# Patient Record
Sex: Female | Born: 1975
Health system: Southern US, Community
[De-identification: ages and names within clinical notes are randomized; demographics above are authoritative.]

## PROBLEM LIST (undated history)

## (undated) DIAGNOSIS — Z8632 Personal history of gestational diabetes: Secondary | ICD-10-CM

## (undated) DIAGNOSIS — F418 Other specified anxiety disorders: Secondary | ICD-10-CM

## (undated) DIAGNOSIS — N200 Calculus of kidney: Secondary | ICD-10-CM

## (undated) DIAGNOSIS — K056 Periodontal disease, unspecified: Secondary | ICD-10-CM

## (undated) DIAGNOSIS — O24419 Gestational diabetes mellitus in pregnancy, unspecified control: Secondary | ICD-10-CM

## (undated) DIAGNOSIS — Z87442 Personal history of urinary calculi: Secondary | ICD-10-CM

## (undated) DIAGNOSIS — F32A Depression, unspecified: Secondary | ICD-10-CM

## (undated) HISTORY — DX: Other specified anxiety disorders: F41.8

## (undated) HISTORY — DX: Depression, unspecified: F32.A

## (undated) HISTORY — PX: VOCAL CORD LATERALIZATION, ENDOSCOPIC APPROACH W/ MLB: SHX2664

## (undated) HISTORY — PX: LITHOTRIPSY: SUR834

---

## 1998-08-01 ENCOUNTER — Encounter: Payer: Self-pay | Admitting: Otolaryngology

## 1998-08-02 ENCOUNTER — Inpatient Hospital Stay (HOSPITAL_COMMUNITY): Admission: RE | Admit: 1998-08-02 | Discharge: 1998-08-03 | Payer: Self-pay | Admitting: Otolaryngology

## 2014-07-13 ENCOUNTER — Emergency Department (HOSPITAL_BASED_OUTPATIENT_CLINIC_OR_DEPARTMENT_OTHER)
Admission: EM | Admit: 2014-07-13 | Discharge: 2014-07-13 | Disposition: A | Discharge status: Home / Self Care | Payer: Self-pay | Attending: Emergency Medicine | Admitting: Emergency Medicine

## 2014-07-13 ENCOUNTER — Encounter (HOSPITAL_BASED_OUTPATIENT_CLINIC_OR_DEPARTMENT_OTHER): Payer: Self-pay | Admitting: Emergency Medicine

## 2014-07-13 DIAGNOSIS — R197 Diarrhea, unspecified: Secondary | ICD-10-CM

## 2014-07-13 DIAGNOSIS — Z8632 Personal history of gestational diabetes: Secondary | ICD-10-CM | POA: Insufficient documentation

## 2014-07-13 DIAGNOSIS — Z3202 Encounter for pregnancy test, result negative: Secondary | ICD-10-CM | POA: Insufficient documentation

## 2014-07-13 DIAGNOSIS — B349 Viral infection, unspecified: Secondary | ICD-10-CM | POA: Insufficient documentation

## 2014-07-13 DIAGNOSIS — Z87442 Personal history of urinary calculi: Secondary | ICD-10-CM | POA: Insufficient documentation

## 2014-07-13 DIAGNOSIS — Z720 Tobacco use: Secondary | ICD-10-CM | POA: Insufficient documentation

## 2014-07-13 HISTORY — DX: Calculus of kidney: N20.0

## 2014-07-13 HISTORY — DX: Gestational diabetes mellitus in pregnancy, unspecified control: O24.419

## 2014-07-13 LAB — URINALYSIS, ROUTINE W REFLEX MICROSCOPIC
Bilirubin Urine: NEGATIVE
Glucose, UA: NEGATIVE mg/dL
Hgb urine dipstick: NEGATIVE
Ketones, ur: 15 mg/dL — AB
Leukocytes, UA: NEGATIVE
Nitrite: NEGATIVE
PH: 7.5 (ref 5.0–8.0)
Protein, ur: NEGATIVE mg/dL
Specific Gravity, Urine: 1.022 (ref 1.005–1.030)
UROBILINOGEN UA: 0.2 mg/dL (ref 0.0–1.0)

## 2014-07-13 LAB — COMPREHENSIVE METABOLIC PANEL
ALT: 14 U/L (ref 14–54)
AST: 16 U/L (ref 15–41)
Albumin: 3.9 g/dL (ref 3.5–5.0)
Alkaline Phosphatase: 92 U/L (ref 38–126)
Anion gap: 6 (ref 5–15)
BUN: 17 mg/dL (ref 6–20)
CALCIUM: 8.9 mg/dL (ref 8.9–10.3)
CO2: 24 mmol/L (ref 22–32)
CREATININE: 0.75 mg/dL (ref 0.44–1.00)
Chloride: 109 mmol/L (ref 101–111)
GFR calc non Af Amer: >60 mL/min (ref >60)
Glucose, Bld: 117 mg/dL — ABNORMAL HIGH (ref 70–99)
Potassium: 3.4 mmol/L — ABNORMAL LOW (ref 3.5–5.1)
SODIUM: 139 mmol/L (ref 135–145)
Total Bilirubin: 0.3 mg/dL (ref 0.3–1.2)
Total Protein: 6.9 g/dL (ref 6.5–8.1)

## 2014-07-13 LAB — CBC WITH DIFFERENTIAL/PLATELET
Basophils Absolute: 0 10*3/uL (ref 0.0–0.1)
Basophils Relative: 0 % (ref 0–1)
Eosinophils Absolute: 0.2 10*3/uL (ref 0.0–0.7)
Eosinophils Relative: 2 % (ref 0–5)
HCT: 38.1 % (ref 36.0–46.0)
Hemoglobin: 12.5 g/dL (ref 12.0–15.0)
Lymphocytes Relative: 10 % — ABNORMAL LOW (ref 12–46)
Lymphs Abs: 1.2 10*3/uL (ref 0.7–4.0)
MCH: 27.5 pg (ref 26.0–34.0)
MCHC: 32.8 g/dL (ref 30.0–36.0)
MCV: 83.9 fL (ref 78.0–100.0)
Monocytes Absolute: 0.5 10*3/uL (ref 0.1–1.0)
Monocytes Relative: 4 % (ref 3–12)
Neutro Abs: 10.7 10*3/uL — ABNORMAL HIGH (ref 1.7–7.7)
Neutrophils Relative %: 84 % — ABNORMAL HIGH (ref 43–77)
Platelets: 250 10*3/uL (ref 150–400)
RBC: 4.54 MIL/uL (ref 3.87–5.11)
RDW: 15.6 % — ABNORMAL HIGH (ref 11.5–15.5)
WBC: 12.7 10*3/uL — ABNORMAL HIGH (ref 4.0–10.5)

## 2014-07-13 LAB — PREGNANCY, URINE: PREG TEST UR: NEGATIVE

## 2014-07-13 LAB — LIPASE, BLOOD: LIPASE: 25 U/L (ref 22–51)

## 2014-07-13 MED ORDER — ONDANSETRON HCL 4 MG/2ML IJ SOLN
4.0000 mg | Freq: Once | INTRAMUSCULAR | Status: AC
  Administered 2014-07-13: 4 mg via INTRAVENOUS

## 2014-07-13 MED ORDER — ONDANSETRON HCL 4 MG PO TABS: 4.0000 mg | ORAL_TABLET | Freq: Four times a day (QID) | ORAL | Status: DC

## 2014-07-13 MED ORDER — SODIUM CHLORIDE 0.9 % IV BOLUS (SEPSIS)
1000.0000 mL | Freq: Once | INTRAVENOUS | Status: AC
  Administered 2014-07-13: 1000 mL via INTRAVENOUS

## 2014-07-13 NOTE — ED Provider Notes (Signed)
CSN: [id number]     Arrival date & time 07/13/14  1156 History   First MD Initiated Contact with Patient 07/13/14 1301     Chief Complaint  Patient presents with  . Diarrhea     (Consider location/radiation/quality/duration/timing/severity/associated sxs/prior Treatment) HPI Comments: 39 year old female complaining of diarrhea and nausea beginning at 3:30 AM today. Reports 3 episodes of nonbloody diarrhea today. No associated abdominal pain, fever or vomiting. No sick contacts. Had [employer] for dinner last night. Had no appetite this morning and has not eaten anything. No aggravating or alleviating factors. States she is starting to feel weak and tired. Traveling from [place] to [place] today for [place].  Patient is a 39 y.o. female presenting with diarrhea. The history is provided by the patient.  Diarrhea   Past Medical History  Diagnosis Date  . Kidney stone   . Gestational diabetes    Past Surgical History  Procedure Laterality Date  . Lithotripsy     No family history on file. History  Substance Use Topics  . Smoking status: Current Every Day Smoker  . Smokeless tobacco: Not on file  . Alcohol Use: Not on file   OB History    No data available     Review of Systems  Constitutional: Positive for fatigue.  Gastrointestinal: Positive for nausea and diarrhea.  Neurological: Positive for weakness.  All other systems reviewed and are negative.     Allergies  Review of patient's allergies indicates no known allergies.  Home Medications   Prior to Admission medications   Medication Sig Start Date End Date Taking? Authorizing Provider  ondansetron (ZOFRAN) 4 MG tablet Take 1 tablet (4 mg total) by mouth every 6 (six) hours. 07/13/14   Dalana Pfahler M Eldonna Neuenfeldt, PA-C   BP 103/80 mmHg  Pulse 85  Temp(Src) 98.1 F (36.7 C) (Oral)  Resp 18  Ht 5\' 8"  (1.727 m)  Wt 180 lb (81.647 kg)  BMI 27.38 kg/m2  SpO2 100%  LMP 07/09/2014 Physical Exam  Constitutional: She is  oriented to person, place, and time. She appears well-developed and well-nourished. No distress.  HENT:  Head: Normocephalic and atraumatic.  Mouth/Throat: Oropharynx is clear and moist.  Eyes: Conjunctivae and EOM are normal.  Neck: Normal range of motion. Neck supple.  Cardiovascular: Normal rate, regular rhythm and normal heart sounds.   Pulmonary/Chest: Effort normal and breath sounds normal. No respiratory distress.  Abdominal: Soft. Bowel sounds are normal. She exhibits no distension and no mass. There is no tenderness. There is no rebound and no guarding.  Musculoskeletal: Normal range of motion. She exhibits no edema.  Neurological: She is alert and oriented to person, place, and time. No sensory deficit.  Skin: Skin is warm and dry.  Psychiatric: She has a normal mood and affect. Her behavior is normal.  Nursing note and vitals reviewed.   ED Course  Procedures (including critical care time) Labs Review Labs Reviewed  CBC WITH DIFFERENTIAL/PLATELET - Abnormal; Notable for the following:    WBC 12.7 (*)    RDW 15.6 (*)    Neutrophils Relative % 84 (*)    Neutro Abs 10.7 (*)    Lymphocytes Relative 10 (*)    All other components within normal limits  COMPREHENSIVE METABOLIC PANEL - Abnormal; Notable for the following:    Potassium 3.4 (*)    Glucose, Bld 117 (*)    All other components within normal limits  URINALYSIS, ROUTINE W REFLEX MICROSCOPIC - Abnormal; Notable for the following:  Ketones, ur 15 (*)    All other components within normal limits  LIPASE, BLOOD  PREGNANCY, URINE    Imaging Review No results found.   EKG Interpretation None      MDM   Final diagnoses:  Diarrhea  Viral illness   Nontoxic appearing, NAD. AF VSS. Abdomen soft and nontender. Moist mucous membranes. Received a bolus of IV fluids prior to my evaluation. Labs obtained in triage prior to patient being seen, leukocytosis of 12.7, no other acute findings. No associated abdominal  pain, fever or vomiting. This is most likely a viral illness, possibly from eating [employer] last night. Will discharge home with Zofran. Advised her to stay well-hydrated. Stable for discharge. Return precautions given. Patient states understanding of treatment care plan and is agreeable.  Kathrynn SpeedRobyn M Verlisa Vara, PA-C 07/13/14 1338  Arby BarretteMarcy Pfeiffer, MD 07/13/14 1524

## 2014-07-13 NOTE — ED Notes (Signed)
Pt had two episodes of diarrhea with some nausea since 0330 this am.   No known fever.  VSS.  CBG 111.

## 2014-07-13 NOTE — Discharge Instructions (Signed)
Take Zofran as directed as needed for nausea. Rest and stay well-hydrated.  Diarrhea Diarrhea is frequent loose and watery bowel movements. It can cause you to feel weak and dehydrated. Dehydration can cause you to become tired and thirsty, have a dry mouth, and have decreased urination that often is dark yellow. Diarrhea is a sign of another problem, most often an infection that will not last long. In most cases, diarrhea typically lasts 2-3 days. However, it can last longer if it is a sign of something more serious. It is important to treat your diarrhea as directed by your caregiver to lessen or prevent future episodes of diarrhea. CAUSES  Some common causes include:  Gastrointestinal infections caused by viruses, bacteria, or parasites.  Food poisoning or food allergies.  Certain medicines, such as antibiotics, chemotherapy, and laxatives.  Artificial sweeteners and fructose.  Digestive disorders. HOME CARE INSTRUCTIONS  Ensure adequate fluid intake (hydration): Have 1 cup (8 oz) of fluid for each diarrhea episode. Avoid fluids that contain simple sugars or sports drinks, fruit juices, whole milk products, and sodas. Your urine should be clear or pale yellow if you are drinking enough fluids. Hydrate with an oral rehydration solution that you can purchase at pharmacies, retail stores, and online. You can prepare an oral rehydration solution at home by mixing the following ingredients together:   - tsp table salt.   tsp baking soda.   tsp salt substitute containing potassium chloride.  1  tablespoons sugar.  1 L (34 oz) of water.  Certain foods and beverages may increase the speed at which food moves through the gastrointestinal (GI) tract. These foods and beverages should be avoided and include:  Caffeinated and alcoholic beverages.  High-fiber foods, such as raw fruits and vegetables, nuts, seeds, and whole grain breads and cereals.  Foods and beverages sweetened with sugar  alcohols, such as xylitol, sorbitol, and mannitol.  Some foods may be well tolerated and may help thicken stool including:  Starchy foods, such as rice, toast, pasta, low-sugar cereal, oatmeal, grits, baked potatoes, crackers, and bagels.  Bananas.  Applesauce.  Add probiotic-rich foods to help increase healthy bacteria in the GI tract, such as yogurt and fermented milk products.  Wash your hands well after each diarrhea episode.  Only take over-the-counter or prescription medicines as directed by your caregiver.  Take a warm bath to relieve any burning or pain from frequent diarrhea episodes. SEEK IMMEDIATE MEDICAL CARE IF:   You are unable to keep fluids down.  You have persistent vomiting.  You have blood in your stool, or your stools are black and tarry.  You do not urinate in 6-8 hours, or there is only a small amount of very dark urine.  You have abdominal pain that increases or localizes.  You have weakness, dizziness, confusion, or light-headedness.  You have a severe headache.  Your diarrhea gets worse or does not get better.  You have a fever or persistent symptoms for more than 2-3 days.  You have a fever and your symptoms suddenly get worse. MAKE SURE YOU:   Understand these instructions.  Will watch your condition.  Will get help right away if you are not doing well or get worse. Document Released: 02/08/2002 Document Revised: 07/05/2013 Document Reviewed: 10/27/2011 [facility] Patient Information 2015 [place], [place]. This information is not intended to replace advice given to you by your health care provider. Make sure you discuss any questions you have with your health care provider.  Food Choices to  Help Relieve Diarrhea When you have diarrhea, the foods you eat and your eating habits are very important. Choosing the right foods and drinks can help relieve diarrhea. Also, because diarrhea can last up to 7 days, you need to replace lost fluids and  electrolytes (such as sodium, potassium, and chloride) in order to help prevent dehydration.  WHAT GENERAL GUIDELINES DO I NEED TO FOLLOW?  Slowly drink 1 cup (8 oz) of fluid for each episode of diarrhea. If you are getting enough fluid, your urine will be clear or pale yellow.  Eat starchy foods. Some good choices include white rice, white toast, pasta, low-fiber cereal, baked potatoes (without the skin), saltine crackers, and bagels.  Avoid large servings of any cooked vegetables.  Limit fruit to two servings per day. A serving is  cup or 1 small piece.  Choose foods with less than 2 g of fiber per serving.  Limit fats to less than 8 tsp (38 g) per day.  Avoid fried foods.  Eat foods that have probiotics in them. Probiotics can be found in certain dairy products.  Avoid foods and beverages that may increase the speed at which food moves through the stomach and intestines (gastrointestinal tract). Things to avoid include:  High-fiber foods, such as dried fruit, raw fruits and vegetables, nuts, seeds, and whole grain foods.  Spicy foods and high-fat foods.  Foods and beverages sweetened with high-fructose corn syrup, honey, or sugar alcohols such as xylitol, sorbitol, and mannitol. WHAT FOODS ARE RECOMMENDED? Grains White rice. White, [place], or pita breads (fresh or toasted), including plain rolls, buns, or bagels. White pasta. Saltine, soda, or graham crackers. Pretzels. Low-fiber cereal. Cooked cereals made with water (such as cornmeal, farina, or cream cereals). Plain muffins. Matzo. Melba toast. Zwieback.  Vegetables Potatoes (without the skin). Strained tomato and vegetable juices. Most well-cooked and canned vegetables without seeds. Tender lettuce. Fruits Cooked or canned applesauce, apricots, cherries, fruit cocktail, grapefruit, peaches, pears, or plums. Fresh bananas, apples without skin, cherries, grapes, cantaloupe, grapefruit, peaches, oranges, or plums.  Meat and  Other Protein Products Baked or boiled chicken. Eggs. Tofu. Fish. Seafood. Smooth peanut butter. Ground or well-cooked tender beef, ham, veal, lamb, pork, or poultry.  Dairy Plain yogurt, kefir, and unsweetened liquid yogurt. Lactose-free milk, buttermilk, or soy milk. Plain hard cheese. Beverages Sport drinks. Clear broths. Diluted fruit juices (except prune). Regular, caffeine-free sodas such as ginger ale. Water. Decaffeinated teas. Oral rehydration solutions. Sugar-free beverages not sweetened with sugar alcohols. Other Bouillon, broth, or soups made from recommended foods.  The items listed above may not be a complete list of recommended foods or beverages. Contact your dietitian for more options. WHAT FOODS ARE NOT RECOMMENDED? Grains Whole grain, whole wheat, bran, or rye breads, rolls, pastas, crackers, and cereals. Wild or brown rice. Cereals that contain more than 2 g of fiber per serving. Corn tortillas or taco shells. Cooked or dry oatmeal. Granola. Popcorn. Vegetables Raw vegetables. Cabbage, broccoli, Brussels sprouts, artichokes, baked beans, beet greens, corn, kale, legumes, peas, sweet potatoes, and yams. Potato skins. Cooked spinach and cabbage. Fruits Dried fruit, including raisins and dates. Raw fruits. Stewed or dried prunes. Fresh apples with skin, apricots, mangoes, pears, raspberries, and strawberries.  Meat and Other Protein Products Chunky peanut butter. Nuts and seeds. Beans and lentils. Berniece Salines.  Dairy High-fat cheeses. Milk, chocolate milk, and beverages made with milk, such as milk shakes. Cream. Ice cream. Sweets and Desserts Sweet rolls, doughnuts, and sweet breads. Pancakes and waffles.  Fats and Oils Butter. Cream sauces. Margarine. Salad oils. Plain salad dressings. Olives. Avocados.  Beverages Caffeinated beverages (such as coffee, tea, soda, or energy drinks). Alcoholic beverages. Fruit juices with pulp. Prune juice. Soft drinks sweetened with high-fructose  corn syrup or sugar alcohols. Other Coconut. Hot sauce. Chili powder. Mayonnaise. Gravy. Cream-based or milk-based soups.  The items listed above may not be a complete list of foods and beverages to avoid. Contact your dietitian for more information. WHAT SHOULD I DO IF I BECOME DEHYDRATED? Diarrhea can sometimes lead to dehydration. Signs of dehydration include dark urine and dry mouth and skin. If you think you are dehydrated, you should rehydrate with an oral rehydration solution. These solutions can be purchased at pharmacies, retail stores, or online.  Drink -1 cup (120-240 mL) of oral rehydration solution each time you have an episode of diarrhea. If drinking this amount makes your diarrhea worse, try drinking smaller amounts more often. For example, drink 1-3 tsp (5-15 mL) every 5-10 minutes.  A general rule for staying hydrated is to drink 1-2 L of fluid per day. Talk to your health care provider about the specific amount you should be drinking each day. Drink enough fluids to keep your urine clear or pale yellow. Document Released: 05/11/2003 Document Revised: 02/23/2013 Document Reviewed: 01/11/2013 [facility] Patient Information 2015 [place], [place]. This information is not intended to replace advice given to you by your health care provider. Make sure you discuss any questions you have with your health care provider. Viral Gastroenteritis Viral gastroenteritis is also known as stomach flu. This condition affects the stomach and intestinal tract. It can cause sudden diarrhea and vomiting. The illness typically lasts 3 to 8 days. Most people develop an immune response that eventually gets rid of the virus. While this natural response develops, the virus can make you quite ill. CAUSES  Many different viruses can cause gastroenteritis, such as rotavirus or noroviruses. You can catch one of these viruses by consuming contaminated food or water. You may also catch a virus by sharing utensils or  other personal items with an infected person or by touching a contaminated surface. SYMPTOMS  The most common symptoms are diarrhea and vomiting. These problems can cause a severe loss of body fluids (dehydration) and a body salt (electrolyte) imbalance. Other symptoms may include:  Fever.  Headache.  Fatigue.  Abdominal pain. DIAGNOSIS  Your caregiver can usually diagnose viral gastroenteritis based on your symptoms and a physical exam. A stool sample may also be taken to test for the presence of viruses or other infections. TREATMENT  This illness typically goes away on its own. Treatments are aimed at rehydration. The most serious cases of viral gastroenteritis involve vomiting so severely that you are not able to keep fluids down. In these cases, fluids must be given through an intravenous line (IV). HOME CARE INSTRUCTIONS   Drink enough fluids to keep your urine clear or pale yellow. Drink small amounts of fluids frequently and increase the amounts as tolerated.  Ask your caregiver for specific rehydration instructions.  Avoid:  Foods high in sugar.  Alcohol.  Carbonated drinks.  Tobacco.  Juice.  Caffeine drinks.  Extremely hot or cold fluids.  Fatty, greasy foods.  Too much intake of anything at one time.  Dairy products until 24 to 48 hours after diarrhea stops.  You may consume probiotics. Probiotics are active cultures of beneficial bacteria. They may lessen the amount and number of diarrheal stools in adults. Probiotics can be  found in yogurt with active cultures and in supplements.  Wash your hands well to avoid spreading the virus.  Only take over-the-counter or prescription medicines for pain, discomfort, or fever as directed by your caregiver. Do not give aspirin to children. Antidiarrheal medicines are not recommended.  Ask your caregiver if you should continue to take your regular prescribed and over-the-counter medicines.  Keep all follow-up  appointments as directed by your caregiver. SEEK IMMEDIATE MEDICAL CARE IF:   You are unable to keep fluids down.  You do not urinate at least once every 6 to 8 hours.  You develop shortness of breath.  You notice blood in your stool or vomit. This may look like coffee grounds.  You have abdominal pain that increases or is concentrated in one small area (localized).  You have persistent vomiting or diarrhea.  You have a fever.  The patient is a child younger than 3 months, and he or she has a fever.  The patient is a child older than 3 months, and he or she has a fever and persistent symptoms.  The patient is a child older than 3 months, and he or she has a fever and symptoms suddenly get worse.  The patient is a baby, and he or she has no tears when crying. MAKE SURE YOU:   Understand these instructions.  Will watch your condition.  Will get help right away if you are not doing well or get worse. Document Released: 02/18/2005 Document Revised: 05/13/2011 Document Reviewed: 12/05/2010 [facility] Patient Information 2015 [place], [place]. This information is not intended to replace advice given to you by your health care provider. Make sure you discuss any questions you have with your health care provider.

## 2015-05-31 DIAGNOSIS — F17200 Nicotine dependence, unspecified, uncomplicated: Secondary | ICD-10-CM | POA: Insufficient documentation

## 2015-05-31 DIAGNOSIS — F410 Panic disorder [episodic paroxysmal anxiety] without agoraphobia: Secondary | ICD-10-CM | POA: Insufficient documentation

## 2017-07-09 DIAGNOSIS — M545 Low back pain: Secondary | ICD-10-CM | POA: Diagnosis not present

## 2017-08-12 DIAGNOSIS — M1288 Other specific arthropathies, not elsewhere classified, other specified site: Secondary | ICD-10-CM | POA: Diagnosis not present

## 2017-08-12 DIAGNOSIS — M545 Low back pain: Secondary | ICD-10-CM | POA: Diagnosis not present

## 2017-11-20 DIAGNOSIS — Z23 Encounter for immunization: Secondary | ICD-10-CM | POA: Diagnosis not present

## 2017-12-30 DIAGNOSIS — R030 Elevated blood-pressure reading, without diagnosis of hypertension: Secondary | ICD-10-CM | POA: Diagnosis not present

## 2017-12-30 DIAGNOSIS — R42 Dizziness and giddiness: Secondary | ICD-10-CM | POA: Diagnosis not present

## 2017-12-31 DIAGNOSIS — R7301 Impaired fasting glucose: Secondary | ICD-10-CM | POA: Diagnosis not present

## 2018-02-12 DIAGNOSIS — M545 Low back pain: Secondary | ICD-10-CM | POA: Diagnosis not present

## 2018-04-01 DIAGNOSIS — J018 Other acute sinusitis: Secondary | ICD-10-CM | POA: Diagnosis not present

## 2018-12-21 ENCOUNTER — Other Ambulatory Visit: Payer: Self-pay | Admitting: Orthopedic Surgery

## 2018-12-21 DIAGNOSIS — M533 Sacrococcygeal disorders, not elsewhere classified: Secondary | ICD-10-CM

## 2019-01-08 ENCOUNTER — Inpatient Hospital Stay: Admission: RE | Admit: 2019-01-08 | Payer: Private Health Insurance - Indemnity | Source: Ambulatory Visit

## 2019-01-08 ENCOUNTER — Other Ambulatory Visit: Payer: Private Health Insurance - Indemnity

## 2019-04-13 ENCOUNTER — Encounter (HOSPITAL_BASED_OUTPATIENT_CLINIC_OR_DEPARTMENT_OTHER): Payer: Self-pay | Admitting: Obstetrics and Gynecology

## 2019-04-13 ENCOUNTER — Other Ambulatory Visit (HOSPITAL_COMMUNITY)
Admission: RE | Admit: 2019-04-13 | Discharge: 2019-04-13 | Disposition: A | Discharge status: Home / Self Care | Payer: Managed Care, Other (non HMO) | Source: Ambulatory Visit | Attending: Obstetrics and Gynecology | Admitting: Obstetrics and Gynecology

## 2019-04-13 DIAGNOSIS — Z20822 Contact with and (suspected) exposure to covid-19: Secondary | ICD-10-CM | POA: Diagnosis not present

## 2019-04-13 DIAGNOSIS — Z01812 Encounter for preprocedural laboratory examination: Secondary | ICD-10-CM | POA: Insufficient documentation

## 2019-04-13 LAB — SARS CORONAVIRUS 2 (TAT 6-24 HRS): SARS Coronavirus 2: NEGATIVE

## 2019-04-13 NOTE — H&P (Signed)
Monica Banks is an 44 y.o. female. Presenting for surgical management of a MAB. She is a smoker and has a hx of sacroiliac joint disease but otherwise uncomplicated. She was seen in the office for routine NOB visit and US demonstrated a FP measuring 7 wga without FHT diagnostic of MAB.   OB History: G4, P3003   Menstrual History: 01/2019 No LMP recorded.    Past Medical History:  Diagnosis Date  . Gestational diabetes   . Kidney stone    PSH: Vocal cord cyst removal and kidney stone removal  No family history on file.  Social History:  reports that she has been smoking. She does not have any smokeless tobacco history on file. No history on file for alcohol and drug.  Allergies: Amoxillicin - Rash  No medications prior to admission.    Review of Systems  There were no vitals taken for this visit. Physical Exam  Gen: well appearing, NAD CV: Reg rate Pulm: NWOB Abd: soft, nondistended, nontender, no masses GYN: uterus 7 week size, no adnexa ttp/CMT Ext: No edema b/l   No results found for this or any previous visit (from the past 24 hour(s)).  No results found.  Assessment/Plan: Pt is a 44 yo G4P3003 presenting @ 10 wga with a MAB measuring ~ 7wga. She was counseled on TVUS results and diagnosis of MAB in the office. Discussed that it is unlikely to be her fault nor could she have prevented it. Reviewed that this miscarriage does not likely reflect her ability to have a successful pregnancy in the future, and that miscarriage is common - 1:5 pregnancies. She was counseled on options for managing missed ab including expectant, medical, and surgical management.  Patient desires definitive surgical management with suction dilation and curettage.   Risks/benefits/ and alternatives reviewed with patient with risks including but not limited to bleeding, infection, uterine perforation, and damage to nearby structures such as the bowel, bladder, vessels, and/or other organs. She  was given opportunity to ask questions and all questions answered. Patient consents to proceed with suction dilation and curettage. Reviewed bleeding precautions. Her blood type is RH positive and she does not need Rhogam. Given instructions and she verbalizes understanding. Discussed the importance of having at least one normal periods after completion of the process, before attempting conception again. Discussed S/S to call back for. All questions answered and pt verbalizes understanding w/out further questions/concerns. Risks discussed including infection, bleeding, damage to surrounding structures, need for additional procedures, postoperative DVT and subsequent scarring. All questions answered. Consent signed in office.  Plan on doxycycline postop and consider cytotec x 3 days. This was already rx-ed for her.   Madelaine Etienne Shonnie Poudrier 04/13/2019, 9:01 AM

## 2019-04-13 NOTE — Progress Notes (Signed)
Spoke w/ via phone for pre-op interview---Monica Banks needs dos----    none          COVID test ------04-13-2019 Arrive at -------530 am 04-15-2019 NPO after ------midnight Medications to take morning of surgery -----flagyl, clindamycin Diabetic medication -----n/a Patient Special Instructions ----- Pre-Op special Istructions ----- Patient verbalized understanding of instructions that were given at this phone interview. Patient denies shortness of breath, chest pain, fever, cough a this phone interview.

## 2019-04-15 ENCOUNTER — Other Ambulatory Visit: Payer: Self-pay

## 2019-04-15 ENCOUNTER — Ambulatory Visit (HOSPITAL_BASED_OUTPATIENT_CLINIC_OR_DEPARTMENT_OTHER): Payer: Managed Care, Other (non HMO) | Admitting: Anesthesiology

## 2019-04-15 ENCOUNTER — Encounter (HOSPITAL_BASED_OUTPATIENT_CLINIC_OR_DEPARTMENT_OTHER)
Admission: RE | Disposition: A | Discharge status: Home / Self Care | Payer: Self-pay | Source: Home / Self Care | Attending: Obstetrics and Gynecology

## 2019-04-15 ENCOUNTER — Ambulatory Visit (HOSPITAL_BASED_OUTPATIENT_CLINIC_OR_DEPARTMENT_OTHER)
Admission: RE | Admit: 2019-04-15 | Discharge: 2019-04-15 | Disposition: A | Discharge status: Home / Self Care | Payer: Managed Care, Other (non HMO) | Attending: Obstetrics and Gynecology | Admitting: Obstetrics and Gynecology

## 2019-04-15 ENCOUNTER — Encounter (HOSPITAL_BASED_OUTPATIENT_CLINIC_OR_DEPARTMENT_OTHER): Payer: Self-pay | Admitting: Obstetrics and Gynecology

## 2019-04-15 DIAGNOSIS — O99331 Smoking (tobacco) complicating pregnancy, first trimester: Secondary | ICD-10-CM | POA: Diagnosis not present

## 2019-04-15 DIAGNOSIS — O021 Missed abortion: Secondary | ICD-10-CM | POA: Insufficient documentation

## 2019-04-15 DIAGNOSIS — Z3A10 10 weeks gestation of pregnancy: Secondary | ICD-10-CM | POA: Insufficient documentation

## 2019-04-15 HISTORY — DX: Periodontal disease, unspecified: K05.6

## 2019-04-15 HISTORY — DX: Personal history of gestational diabetes: Z86.32

## 2019-04-15 HISTORY — DX: Personal history of urinary calculi: Z87.442

## 2019-04-15 HISTORY — PX: DILATION AND EVACUATION: SHX1459

## 2019-04-15 LAB — TYPE AND SCREEN
ABO/RH(D): A POS
Antibody Screen: NEGATIVE

## 2019-04-15 LAB — ABO/RH: ABO/RH(D): A POS

## 2019-04-15 SURGERY — DILATION AND EVACUATION, UTERUS
Anesthesia: General | Site: Vagina

## 2019-04-15 MED ORDER — LACTATED RINGERS IV SOLN
INTRAVENOUS | Status: DC
  Filled 2019-04-15: qty 1000

## 2019-04-15 MED ORDER — KETOROLAC TROMETHAMINE 30 MG/ML IJ SOLN
INTRAMUSCULAR | Status: DC | PRN
  Administered 2019-04-15: 30 mg via INTRAVENOUS

## 2019-04-15 MED ORDER — ONDANSETRON HCL 4 MG/2ML IJ SOLN
INTRAMUSCULAR | Status: DC | PRN
  Administered 2019-04-15: 4 mg via INTRAVENOUS

## 2019-04-15 MED ORDER — ONDANSETRON HCL 4 MG/2ML IJ SOLN
INTRAMUSCULAR | Status: AC
  Filled 2019-04-15: qty 2

## 2019-04-15 MED ORDER — FENTANYL CITRATE (PF) 100 MCG/2ML IJ SOLN
INTRAMUSCULAR | Status: AC
  Filled 2019-04-15: qty 2

## 2019-04-15 MED ORDER — WHITE PETROLATUM EX OINT
TOPICAL_OINTMENT | CUTANEOUS | Status: AC
  Filled 2019-04-15: qty 5

## 2019-04-15 MED ORDER — MIDAZOLAM HCL 2 MG/2ML IJ SOLN
INTRAMUSCULAR | Status: AC
  Filled 2019-04-15: qty 2

## 2019-04-15 MED ORDER — FENTANYL CITRATE (PF) 100 MCG/2ML IJ SOLN
INTRAMUSCULAR | Status: DC | PRN
  Administered 2019-04-15 (×2): 25 ug via INTRAVENOUS
  Administered 2019-04-15: 50 ug via INTRAVENOUS

## 2019-04-15 MED ORDER — CHLOROPROCAINE HCL 1 % IJ SOLN
INTRAMUSCULAR | Status: DC | PRN
  Administered 2019-04-15: 10 mL

## 2019-04-15 MED ORDER — KETOROLAC TROMETHAMINE 30 MG/ML IJ SOLN
INTRAMUSCULAR | Status: AC
  Filled 2019-04-15: qty 1

## 2019-04-15 MED ORDER — LIDOCAINE 2% (20 MG/ML) 5 ML SYRINGE
INTRAMUSCULAR | Status: DC | PRN
  Administered 2019-04-15: 100 mg via INTRAVENOUS

## 2019-04-15 MED ORDER — PROPOFOL 10 MG/ML IV BOLUS
INTRAVENOUS | Status: DC | PRN
  Administered 2019-04-15: 150 mg via INTRAVENOUS

## 2019-04-15 MED ORDER — LIDOCAINE 2% (20 MG/ML) 5 ML SYRINGE
INTRAMUSCULAR | Status: AC
  Filled 2019-04-15: qty 5

## 2019-04-15 MED ORDER — PROPOFOL 10 MG/ML IV BOLUS
INTRAVENOUS | Status: AC
  Filled 2019-04-15: qty 20

## 2019-04-15 MED ORDER — DEXAMETHASONE SODIUM PHOSPHATE 10 MG/ML IJ SOLN
INTRAMUSCULAR | Status: DC | PRN
  Administered 2019-04-15 (×2): 5 mg via INTRAVENOUS

## 2019-04-15 MED ORDER — HYDROMORPHONE HCL 1 MG/ML IJ SOLN
INTRAMUSCULAR | Status: AC
  Filled 2019-04-15: qty 1

## 2019-04-15 MED ORDER — ONDANSETRON HCL 4 MG/2ML IJ SOLN
4.0000 mg | Freq: Once | INTRAMUSCULAR | Status: DC | PRN
  Filled 2019-04-15: qty 2

## 2019-04-15 MED ORDER — MEPERIDINE HCL 25 MG/ML IJ SOLN
6.2500 mg | INTRAMUSCULAR | Status: DC | PRN
  Filled 2019-04-15: qty 1

## 2019-04-15 MED ORDER — HYDROMORPHONE HCL 1 MG/ML IJ SOLN
0.2500 mg | INTRAMUSCULAR | Status: DC | PRN
  Administered 2019-04-15: 0.25 mg via INTRAVENOUS
  Filled 2019-04-15: qty 0.5

## 2019-04-15 MED ORDER — MIDAZOLAM HCL 2 MG/2ML IJ SOLN
INTRAMUSCULAR | Status: DC | PRN
  Administered 2019-04-15: 2 mg via INTRAVENOUS

## 2019-04-15 MED ORDER — DEXAMETHASONE SODIUM PHOSPHATE 10 MG/ML IJ SOLN
INTRAMUSCULAR | Status: AC
  Filled 2019-04-15: qty 1

## 2019-04-15 SURGICAL SUPPLY — 21 items
CATH ROBINSON RED A/P 16FR (CATHETERS) ×3 IMPLANT
DECANTER SPIKE VIAL GLASS SM (MISCELLANEOUS) ×3 IMPLANT
GLOVE BIO SURGEON STRL SZ 6.5 (GLOVE) ×2 IMPLANT
GLOVE BIO SURGEONS STRL SZ 6.5 (GLOVE) ×1
GLOVE BIOGEL PI IND STRL 7.0 (GLOVE) ×2 IMPLANT
GLOVE BIOGEL PI INDICATOR 7.0 (GLOVE) ×4
GOWN STRL REUS W/ TWL LRG LVL3 (GOWN DISPOSABLE) ×2 IMPLANT
GOWN STRL REUS W/TWL LRG LVL3 (GOWN DISPOSABLE) ×6
HIBICLENS CHG 4% 4OZ (MISCELLANEOUS) ×1 IMPLANT
KIT BERKELEY 1ST TRI 3/8 NO TR (MISCELLANEOUS) ×3 IMPLANT
KIT BERKELEY 1ST TRIMESTER 3/8 (MISCELLANEOUS) ×3 IMPLANT
NS IRRIG 1000ML POUR BTL (IV SOLUTION) ×3 IMPLANT
PACK VAGINAL MINOR WOMEN LF (CUSTOM PROCEDURE TRAY) ×3 IMPLANT
PAD OB MATERNITY 4.3X12.25 (PERSONAL CARE ITEMS) ×3 IMPLANT
PAD PREP 24X48 CUFFED NSTRL (MISCELLANEOUS) ×3 IMPLANT
SET BERKELEY SUCTION TUBING (SUCTIONS) ×3 IMPLANT
TOWEL OR 17X26 10 PK STRL BLUE (TOWEL DISPOSABLE) ×3 IMPLANT
VACURETTE 10 RIGID CVD (CANNULA) IMPLANT
VACURETTE 7MM CVD STRL WRAP (CANNULA) IMPLANT
VACURETTE 8 RIGID CVD (CANNULA) ×2 IMPLANT
VACURETTE 9 RIGID CVD (CANNULA) IMPLANT

## 2019-04-15 NOTE — Transfer of Care (Signed)
Immediate Anesthesia Transfer of Care Note  Patient: Monica Banks  Procedure(s) Performed: Procedure(s) (LRB): DILATATION AND EVACUATION (N/A)  Patient Location: PACU  Anesthesia Type: General  Level of Consciousness: awake, oriented, sedated and patient cooperative  Airway & Oxygen Therapy: Patient Spontanous Breathing and Patient connected to face mask oxygen  Post-op Assessment: Report given to PACU RN and Post -op Vital signs reviewed and stable  Post vital signs: Reviewed and stable  Complications: No apparent anesthesia complications  Last Vitals:  Vitals Value Taken Time  BP 118/85 04/15/19 0748  Temp 36.8 C 04/15/19 0748  Pulse 95 04/15/19 0752  Resp 14 04/15/19 0752  SpO2 99 % 04/15/19 0752  Vitals shown include unvalidated device data.  Last Pain: There were no vitals filed for this visit.

## 2019-04-15 NOTE — Anesthesia Preprocedure Evaluation (Signed)
Anesthesia Evaluation  Patient identified by MRN, date of birth, ID band Patient awake    Reviewed: Allergy & Precautions, NPO status , Patient's Chart, lab work & pertinent test results  Airway Mallampati: I  TM Distance: >3 FB Neck ROM: Full    Dental   Pulmonary Current Smoker and Patient abstained from smoking.,    Pulmonary exam normal        Cardiovascular Normal cardiovascular exam     Neuro/Psych    GI/Hepatic   Endo/Other    Renal/GU      Musculoskeletal   Abdominal   Peds  Hematology   Anesthesia Other Findings   Reproductive/Obstetrics                             Anesthesia Physical Anesthesia Plan  ASA: II  Anesthesia Plan: General   Post-op Pain Management:    Induction: Intravenous  PONV Risk Score and Plan: 2 and Ondansetron and Midazolam  Airway Management Planned: LMA  Additional Equipment:   Intra-op Plan:   Post-operative Plan: Extubation in OR  Informed Consent: I have reviewed the patients History and Physical, chart, labs and discussed the procedure including the risks, benefits and alternatives for the proposed anesthesia with the patient or authorized representative who has indicated his/her understanding and acceptance.       Plan Discussed with: CRNA and Surgeon  Anesthesia Plan Comments:         Anesthesia Quick Evaluation

## 2019-04-15 NOTE — Op Note (Signed)
PREOPERATIVE DIAGNOSES: 1. Missed Abortion  POSTOPERATIVE DIAGNOSES: Same  PROCEDURE PERFORMED: Dilation, suction, sharp curretage  SURGEON: Dr. Belva Agee  ANESTHESIA: Paracervical block and IV sedation  ESTIMATED BLOOD LOSS: 50cc.  COMPLICATIONS: None  TUBES: None.  DRAINS: None  PATHOLOGY: Endometrial curettings  FINDINGS: On exam, under anesthesia, normal appearing vulva and vagina, 7 week sized uterus  Operative findings demonstrated plethora of POCs  Procedure: The patient was taken to the operating room where she was properly prepped and draped in sterile manner under general anesthesia. After bimanual examination, the cervix was exposed with a speculum and the anterior lip of the cervix grasped with a tenaculum. Paracervical block performed. The endocervical canal was then progressively dilated to 70mm. Suction catheter was introduced into the uterus and to the uterine fundus. The uterus was evacuated and good tissue return was noted. A sharp curettage was then performed until gritty texture noted. All instruments were removed from vagina. The sponge and lap counts were correct times 2 at this time. The patient's procedure was terminated. We then awakened her. She was sent to the Recovery Room in good condition.    Belva Agee MD

## 2019-04-15 NOTE — Anesthesia Procedure Notes (Signed)
Procedure Name: LMA Insertion Date/Time: 04/15/2019 7:21 AM Performed by: Francie Massing, CRNA Pre-anesthesia Checklist: Patient identified, Emergency Drugs available, Suction available and Patient being monitored Patient Re-evaluated:Patient Re-evaluated prior to induction Oxygen Delivery Method: Circle system utilized Preoxygenation: Pre-oxygenation with 100% oxygen Induction Type: IV induction Ventilation: Mask ventilation without difficulty LMA: LMA inserted LMA Size: 4.0 Number of attempts: 1 Airway Equipment and Method: Bite block Placement Confirmation: positive ETCO2 Tube secured with: Tape Dental Injury: Teeth and Oropharynx as per pre-operative assessment

## 2019-04-15 NOTE — Progress Notes (Signed)
No updates to above H&P. Patient arrived NPO and was consented in PACU. Risks again discussed, all questions answered, and consent signed. Proceed with above surgery.    Vearl Aitken MD  

## 2019-04-15 NOTE — Discharge Instructions (Signed)
°  Post Anesthesia Home Care Instructions ° °Activity: °Get plenty of rest for the remainder of the day. A responsible individual must stay with you for 24 hours following the procedure.  °For the next 24 hours, DO NOT: °-Drive a car °-Operate machinery °-Drink alcoholic beverages °-Take any medication unless instructed by your physician °-Make any legal decisions or sign important papers. ° °Meals: °Start with liquid foods such as gelatin or soup. Progress to regular foods as tolerated. Avoid greasy, spicy, heavy foods. If nausea and/or vomiting occur, drink only clear liquids until the nausea and/or vomiting subsides. Call your physician if vomiting continues. ° °Special Instructions/Symptoms: °Your throat may feel dry or sore from the anesthesia or the breathing tube placed in your throat during surgery. If this causes discomfort, gargle with warm salt water. The discomfort should disappear within 24 hours. ° °DISCHARGE INSTRUCTIONS: D&E °The following instructions have been prepared to help you care for yourself upon your return home. °  °Personal hygiene: ° Use sanitary pads for vaginal drainage, not tampons. ° Shower the day after your procedure. ° NO tub baths, pools or Jacuzzis for 2-3 weeks. ° Wipe front to back after using the bathroom. ° °Activity and limitations: ° Do NOT drive or operate any equipment for 24 hours. The effects of anesthesia are still present and drowsiness may result. ° Do NOT rest in bed all day. ° Walking is encouraged. ° Walk up and down stairs slowly. ° You may resume your normal activity in one to two days or as indicated by your physician. ° °Sexual activity: NO intercourse for at least 2 weeks after the procedure, or as indicated by your physician. ° °Diet: Eat a light meal as desired this evening. You may resume your usual diet tomorrow. ° °Return to work: You may resume your work activities in one to two days or as indicated by your doctor. ° °What to expect after your surgery:  Expect to have vaginal bleeding/discharge for 2-3 days and spotting for up to 10 days. It is not unusual to have soreness for up to 1-2 weeks. You may have a slight burning sensation when you urinate for the first day. Mild cramps may continue for a couple of days. You may have a regular period in 2-6 weeks. ° °Call your doctor for any of the following: ° Excessive vaginal bleeding, saturating and changing one pad every hour. ° Inability to urinate 6 hours after discharge from hospital. ° Pain not relieved by pain medication. ° Fever of 100.4° F or greater. ° Unusual vaginal discharge or odor. ° ° ° ° °  °    °

## 2019-04-15 NOTE — Anesthesia Postprocedure Evaluation (Signed)
Anesthesia Post Note  Patient: Monica Banks  Procedure(s) Performed: DILATATION AND EVACUATION (N/A Vagina )     Patient location during evaluation: PACU Anesthesia Type: General Level of consciousness: awake and alert Pain management: pain level controlled Vital Signs Assessment: post-procedure vital signs reviewed and stable Respiratory status: spontaneous breathing, nonlabored ventilation, respiratory function stable and patient connected to nasal cannula oxygen Cardiovascular status: blood pressure returned to baseline and stable Postop Assessment: no apparent nausea or vomiting Anesthetic complications: no    Last Vitals:  Vitals:   04/15/19 0748 04/15/19 0800  BP: 118/85 114/76  Pulse: 88 83  Resp: (!) 22 16  Temp: 36.8 C   SpO2: 100% 98%    Last Pain:  Vitals:   04/15/19 0748  PainSc: 0-No pain                 Maxine Huynh DAVID

## 2019-04-16 LAB — SURGICAL PATHOLOGY

## 2019-07-03 ENCOUNTER — Other Ambulatory Visit: Payer: Self-pay | Admitting: Family Medicine

## 2019-07-03 MED ORDER — ALPRAZOLAM 0.25 MG PO TABS: 0.2500 mg | ORAL_TABLET | Freq: Two times a day (BID) | ORAL | 0 refills | Status: DC | PRN

## 2019-07-03 MED ORDER — VARENICLINE TARTRATE 0.5 MG PO TABS: ORAL_TABLET | ORAL | 0 refills | Status: DC

## 2019-07-07 ENCOUNTER — Telehealth: Payer: Self-pay

## 2019-07-07 NOTE — Telephone Encounter (Signed)
This is our Buyer, retail. Please ask if pt has tried wellbutrin (bupropion) and if she would like to try it. I also usually combine with nicoderm patches  Please let me know what she would like to do.  Tell her I am very happy she is trying to quit! kc

## 2019-07-07 NOTE — Telephone Encounter (Signed)
Patient must try and fail Bupropion before insurance will cover Chantix.

## 2019-07-08 ENCOUNTER — Other Ambulatory Visit: Payer: Self-pay | Admitting: Family Medicine

## 2019-07-08 MED ORDER — BUPROPION HCL ER (XL) 150 MG PO TB24: ORAL_TABLET | ORAL | 0 refills | Status: DC

## 2019-07-08 MED ORDER — NICOTINE 21 MG/24HR TD PT24: 21.0000 mg | MEDICATED_PATCH | Freq: Every day | TRANSDERMAL | 0 refills | Status: DC

## 2019-07-08 NOTE — Telephone Encounter (Signed)
Beth said she is ok with trying Wellbutrin combined with the nicoderm patches

## 2019-08-23 ENCOUNTER — Ambulatory Visit (INDEPENDENT_AMBULATORY_CARE_PROVIDER_SITE_OTHER): Payer: Managed Care, Other (non HMO) | Admitting: Family Medicine

## 2019-08-23 ENCOUNTER — Encounter: Payer: Self-pay | Admitting: Family Medicine

## 2019-08-23 ENCOUNTER — Other Ambulatory Visit: Payer: Self-pay

## 2019-08-23 VITALS — BP 122/80 | HR 80 | Temp 97.8°F | Ht 68.0 in

## 2019-08-23 DIAGNOSIS — R002 Palpitations: Secondary | ICD-10-CM | POA: Diagnosis not present

## 2019-08-23 DIAGNOSIS — F331 Major depressive disorder, recurrent, moderate: Secondary | ICD-10-CM | POA: Diagnosis not present

## 2019-08-23 DIAGNOSIS — E782 Mixed hyperlipidemia: Secondary | ICD-10-CM | POA: Diagnosis not present

## 2019-08-23 MED ORDER — LORAZEPAM 0.5 MG PO TABS: 0.5000 mg | ORAL_TABLET | Freq: Every day | ORAL | 1 refills | Status: DC | PRN

## 2019-08-23 MED ORDER — ESCITALOPRAM OXALATE 10 MG PO TABS: 10.0000 mg | ORAL_TABLET | Freq: Every day | ORAL | 1 refills | Status: DC

## 2019-08-23 NOTE — Patient Instructions (Signed)

## 2019-08-23 NOTE — Progress Notes (Signed)
Subjective:  Patient ID: Monica Banks, female    DOB: 19-Aug-1975  Age: 44 y.o. MRN: [id number]  Chief Complaint  Patient presents with  . Depression    Patiet would like to restart Lexapro  . Anxiety    HPI  Patient presents for follow up of anxiety and depression. She has had an increase in symptoms recently due to the death of her father, medical issues with her boyfriend, and a miscarriage.  The diagnosis of depression was made several years ago.  Presently, she feels a moderate degree of depression.  Currently not on any antidepressants.  She was previously on lexapro and did well. I had given her wellbutrin, but she did not tolerate it. It made her agitated. She was trying the wellbutrin and nicoderm patches, but she was unable to quit yet.  Current affective symptoms include anxious mood, fatigue and sadness.  She has had issues with sleep. she can go to sleep, but then cannot stay asleep.  She denies anhedonia, weight change.  The symptoms are frequent, and present most days.  Presently, Monica Banks denies suicidal ideation.  She is divorced.  Psychiatric history is significant for prior depressive episodes and generalized anxiety disorder.  She was on  Lexapro in the past and she would like to retry it.  Current Outpatient Medications on File Prior to Visit  Medication Sig Dispense Refill  . acetaminophen (TYLENOL) 500 MG tablet Take 1,000 mg by mouth every 6 (six) hours as needed.    Marland Kitchen ibuprofen (ADVIL) 200 MG tablet Take 200 mg by mouth every 6 (six) hours as needed. Took 4 of  200 mg tabs     No current facility-administered medications on file prior to visit.   Past Medical History:  Diagnosis Date  . Depression   . History of gestational diabetes   . History of kidney stones   . Other specified anxiety disorders   . Periodontal disease    on flagyl and clindamycin   Past Surgical History:  Procedure Laterality Date  . DILATION AND EVACUATION N/A 04/15/2019   Procedure:  DILATATION AND EVACUATION;  Surgeon: Ranae Pila, MD;  Location: [facility];  Service: Gynecology;  Laterality: N/A;  . LITHOTRIPSY     x 1  . VOCAL CORD LATERALIZATION, ENDOSCOPIC APPROACH W/ MLB     vocal cord CYST removal    Family History  Problem Relation Age of Onset  . Hyperlipidemia Mother   . Diabetes Mother   . Diabetes Brother    Social History   Socioeconomic History  . Marital status: Divorced    Spouse name: Not on file  . Number of children: Not on file  . Years of education: Not on file  . Highest education level: Not on file  Occupational History  . Not on file  Tobacco Use  . Smoking status: Current Every Day Smoker    Packs/day: 1.00    Years: 29.00    Pack years: 29.00    Types: Cigarettes  . Smokeless tobacco: Never Used  Vaping Use  . Vaping Use: Never used  Substance and Sexual Activity  . Alcohol use: Never  . Drug use: Yes    Types: Marijuana    Comment: marijuana last used 04-12-2019 for nerves  . Sexual activity: Not on file  Other Topics Concern  . Not on file  Social History Narrative  . Not on file   Social Determinants of Health   Financial Resource Strain:   .  Difficulty of Paying Living Expenses:   Food Insecurity:   . Worried About Programme researcher, broadcasting/film/video in the Last Year:   . Barista in the Last Year:   Transportation Needs:   . Freight forwarder (Medical):   Marland Kitchen Lack of Transportation (Non-Medical):   Physical Activity:   . Days of Exercise per Week:   . Minutes of Exercise per Session:   Stress:   . Feeling of Stress :   Social Connections:   . Frequency of Communication with Friends and Family:   . Frequency of Social Gatherings with Friends and Family:   . Attends Religious Services:   . Active Member of Clubs or Organizations:   . Attends Banker Meetings:   Marland Kitchen Marital Status:     Review of Systems  Constitutional: Negative for chills, fatigue and fever.  HENT:  Negative for congestion, ear pain and sore throat.   Respiratory: Negative for cough and shortness of breath.   Cardiovascular: Positive for palpitations. Negative for chest pain.  Psychiatric/Behavioral: Positive for dysphoric mood and sleep disturbance. Negative for suicidal ideas. The patient is nervous/anxious.    Objective:  BP 122/80   Pulse 80   Temp 97.8 F (36.6 C)   Ht 5\' 8"  (1.727 m)   SpO2 98%   BMI 25.89 kg/m   BP/Weight 08/23/2019 04/15/2019 07/13/2014  Systolic BP 122 110 103  Diastolic BP 80 68 80  Wt. (Lbs) - 170.3 180  BMI 25.89 25.89 27.38    Physical Exam Vitals reviewed.  Constitutional:      Appearance: Normal appearance. She is normal weight.  Cardiovascular:     Rate and Rhythm: Normal rate and regular rhythm.     Pulses: Normal pulses.     Heart sounds: Normal heart sounds.  Pulmonary:     Effort: Pulmonary effort is normal. No respiratory distress.     Breath sounds: Normal breath sounds.  Abdominal:     General: Abdomen is flat. Bowel sounds are normal.     Palpations: Abdomen is soft.     Tenderness: There is no abdominal tenderness.  Neurological:     Mental Status: She is alert and oriented to person, place, and time.  Psychiatric:        Behavior: Behavior normal.     Comments: Flat affect.      Diabetic Foot Exam - Simple   No data filed       Lab Results  Component Value Date   WBC 12.7 (H) 07/13/2014   HGB 12.5 07/13/2014   HCT 38.1 07/13/2014   PLT 250 07/13/2014   GLUCOSE 117 (H) 07/13/2014   ALT 14 07/13/2014   AST 16 07/13/2014   NA 139 07/13/2014   K 3.4 (L) 07/13/2014   CL 109 07/13/2014   CREATININE 0.75 07/13/2014   BUN 17 07/13/2014   CO2 24 07/13/2014      Assessment & Plan:   1. Depression, major, recurrent, moderate (HCC) Start on lexapro 10 mg once daily and start lorazepam 0.5 mg once daily prn.  2. Mixed hyperlipidemia Had high cholesterol a year ago.  Labs to be drawn tomorrow.  - CBC with  Differential/Platelet - Comprehensive metabolic panel - Lipid panel  3. Palpitations  - Decrease caffeine. - EKG: NSR. No st changes. - TSH - order ZIOS 14 DAY PATCH  Meds ordered this encounter  Medications  . escitalopram (LEXAPRO) 10 MG tablet    Sig: Take 1 tablet (  10 mg total) by mouth daily.    Dispense:  30 tablet    Refill:  1  . LORazepam (ATIVAN) 0.5 MG tablet    Sig: Take 1 tablet (0.5 mg total) by mouth daily as needed for anxiety.    Dispense:  30 tablet    Refill:  1    Orders Placed This Encounter  Procedures  . CBC with Differential/Platelet  . Comprehensive metabolic panel  . TSH  . Lipid panel  . Ambulatory referral to Cardiology  . EKG 12-Lead     Follow-up: Return in about 4 weeks (around 09/20/2019) for with Gay Filler or me..  An After Visit Summary was printed and given to the patient.  Rochel Brome [facility] [phone]

## 2019-08-25 LAB — CBC WITH DIFFERENTIAL/PLATELET
Basophils Absolute: 0.1 10*3/uL (ref 0.0–0.2)
Basos: 1 %
EOS (ABSOLUTE): 0.4 10*3/uL (ref 0.0–0.4)
Eos: 4 %
Hematocrit: 38.6 % (ref 34.0–46.6)
Hemoglobin: 13.2 g/dL (ref 11.1–15.9)
Immature Grans (Abs): 0 10*3/uL (ref 0.0–0.1)
Immature Granulocytes: 0 %
Lymphocytes Absolute: 2.7 10*3/uL (ref 0.7–3.1)
Lymphs: 26 %
MCH: 29.2 pg (ref 26.6–33.0)
MCHC: 34.2 g/dL (ref 31.5–35.7)
MCV: 85 fL (ref 79–97)
Monocytes Absolute: 0.6 10*3/uL (ref 0.1–0.9)
Monocytes: 6 %
Neutrophils Absolute: 6.4 10*3/uL (ref 1.4–7.0)
Neutrophils: 63 %
Platelets: 254 10*3/uL (ref 150–450)
RBC: 4.52 x10E6/uL (ref 3.77–5.28)
RDW: 12.9 % (ref 11.7–15.4)
WBC: 10.2 10*3/uL (ref 3.4–10.8)

## 2019-08-25 LAB — COMPREHENSIVE METABOLIC PANEL
ALT: 9 IU/L (ref 0–32)
AST: 15 IU/L (ref 0–40)
Albumin/Globulin Ratio: 1.6 (ref 1.2–2.2)
Albumin: 4.1 g/dL (ref 3.8–4.8)
Alkaline Phosphatase: 88 IU/L (ref 48–121)
BUN/Creatinine Ratio: 16 (ref 9–23)
BUN: 13 mg/dL (ref 6–24)
Bilirubin Total: <0.2 mg/dL (ref 0.0–1.2)
CO2: 20 mmol/L (ref 20–29)
Calcium: 9.5 mg/dL (ref 8.7–10.2)
Chloride: 108 mmol/L — ABNORMAL HIGH (ref 96–106)
Creatinine, Ser: 0.8 mg/dL (ref 0.57–1.00)
GFR calc Af Amer: 104 mL/min/{1.73_m2} (ref >59)
GFR calc non Af Amer: 91 mL/min/{1.73_m2} (ref >59)
Globulin, Total: 2.6 g/dL (ref 1.5–4.5)
Glucose: 97 mg/dL (ref 65–99)
Potassium: 4 mmol/L (ref 3.5–5.2)
Sodium: 141 mmol/L (ref 134–144)
Total Protein: 6.7 g/dL (ref 6.0–8.5)

## 2019-08-25 LAB — LIPID PANEL
Chol/HDL Ratio: 5.7 ratio — ABNORMAL HIGH (ref 0.0–4.4)
Cholesterol, Total: 200 mg/dL — ABNORMAL HIGH (ref 100–199)
HDL: 35 mg/dL — ABNORMAL LOW (ref >39)
LDL Chol Calc (NIH): 143 mg/dL — ABNORMAL HIGH (ref 0–99)
Triglycerides: 122 mg/dL (ref 0–149)
VLDL Cholesterol Cal: 22 mg/dL (ref 5–40)

## 2019-08-25 LAB — TSH: TSH: 2.75 u[IU]/mL (ref 0.450–4.500)

## 2019-08-25 LAB — CARDIOVASCULAR RISK ASSESSMENT

## 2019-09-10 ENCOUNTER — Ambulatory Visit: Payer: Managed Care, Other (non HMO) | Admitting: Cardiology

## 2019-10-27 ENCOUNTER — Ambulatory Visit (INDEPENDENT_AMBULATORY_CARE_PROVIDER_SITE_OTHER): Payer: Managed Care, Other (non HMO) | Admitting: Family Medicine

## 2019-10-27 ENCOUNTER — Encounter: Payer: Self-pay | Admitting: Family Medicine

## 2019-10-27 VITALS — BP 102/62 | HR 88 | Temp 98.1°F | Ht 68.0 in | Wt 172.0 lb

## 2019-10-27 DIAGNOSIS — R52 Pain, unspecified: Secondary | ICD-10-CM

## 2019-10-27 LAB — POC COVID19 BINAXNOW: SARS Coronavirus 2 Ag: NEGATIVE

## 2019-10-27 NOTE — Progress Notes (Signed)
Acute Office Visit  Subjective:    Patient ID: Monica Banks, female    DOB: 1975/12/27, 44 y.o.   MRN: [id number]  Chief Complaint  Patient presents with  . Body aches    Started this morning and feels like she is running a fever    HPI Patient is in today for muscle pain and body aches. Pt states onset overnight. Pt states she has felt night sweats over the last 3-4 nights. Increase in fatigue. Fever onset this morning-took tylenol 500mg  (2)  During lunchtime today.  NO cough, congestion, diarrhea or rash. Pt with +tob 1pk/day. Pt works in . NO vaccine  Past Medical History:  Diagnosis Date  . Depression   . History of gestational diabetes   . History of kidney stones   . Other specified anxiety disorders   . Periodontal disease    on flagyl and clindamycin    Past Surgical History:  Procedure Laterality Date  . DILATION AND EVACUATION N/A 04/15/2019   Procedure: DILATATION AND EVACUATION;  Surgeon: 06/13/2019, MD;  Location: [facility];  Service: Gynecology;  Laterality: N/A;  . LITHOTRIPSY     x 1  . VOCAL CORD LATERALIZATION, ENDOSCOPIC APPROACH W/ MLB     vocal cord CYST removal    Family History  Problem Relation Age of Onset  . Hyperlipidemia Mother   . Diabetes Mother   . Diabetes Brother     Social History   Socioeconomic History  . Marital status: Divorced    Spouse name: Not on file  . Number of children: Not on file  . Years of education: Not on file  . Highest education level: Not on file  Occupational History  . Not on file  Tobacco Use  . Smoking status: Current Every Day Smoker    Packs/day: 1.00    Years: 29.00    Pack years: 29.00    Types: Cigarettes  . Smokeless tobacco: Never Used  Vaping Use  . Vaping Use: Never used  Substance and Sexual Activity  . Alcohol use: Never  . Drug use: Yes    Types: Marijuana    Comment: marijuana last used 04-12-2019 for nerves  . Sexual activity: Not on  file  Other Topics Concern  . Not on file  Social History Narrative  . Not on file   Social Determinants of Health   Financial Resource Strain:   . Difficulty of Paying Living Expenses: Not on file  Food Insecurity:   . Worried About 06-10-2019 in the Last Year: Not on file  . Ran Out of Food in the Last Year: Not on file  Transportation Needs:   . Lack of Transportation (Medical): Not on file  . Lack of Transportation (Non-Medical): Not on file  Physical Activity:   . Days of Exercise per Week: Not on file  . Minutes of Exercise per Session: Not on file  Stress:   . Feeling of Stress : Not on file  Social Connections:   . Frequency of Communication with Friends and Family: Not on file  . Frequency of Social Gatherings with Friends and Family: Not on file  . Attends Religious Services: Not on file  . Active Member of Clubs or Organizations: Not on file  . Attends Programme researcher, broadcasting/film/video Meetings: Not on file  . Marital Status: Not on file  Intimate Partner Violence:   . Fear of Current or Ex-Partner: Not on file  .  Emotionally Abused: Not on file  . Physically Abused: Not on file  . Sexually Abused: Not on file    Outpatient Medications Prior to Visit  Medication Sig Dispense Refill  . acetaminophen (TYLENOL) 500 MG tablet Take 1,000 mg by mouth every 6 (six) hours as needed.    Marland Kitchen escitalopram (LEXAPRO) 10 MG tablet Take 1 tablet (10 mg total) by mouth daily. 30 tablet 1  . ibuprofen (ADVIL) 200 MG tablet Take 200 mg by mouth every 6 (six) hours as needed. Took 4 of  200 mg tabs    . LORazepam (ATIVAN) 0.5 MG tablet Take 1 tablet (0.5 mg total) by mouth daily as needed for anxiety. 30 tablet 1   No facility-administered medications prior to visit.    Allergies  Allergen Reactions  . Amoxicillin     Severe itching    Review of Systems  Constitutional: Positive for chills, fever and malaise/fatigue.  HENT: Negative for sore throat.   Respiratory: Negative for  cough.   Cardiovascular: Negative for chest pain.  Gastrointestinal: Negative for diarrhea.  Genitourinary: Negative for dysuria.  Musculoskeletal: Positive for myalgias.  Skin: Negative for rash.  Neurological: Negative for headaches.      Objective:    Physical Exam Constitutional:      Appearance: Normal appearance.  HENT:     Head: Normocephalic and atraumatic.     Right Ear: Tympanic membrane normal.     Left Ear: Tympanic membrane normal.     Nose: Nose normal.     Mouth/Throat:     Mouth: Mucous membranes are moist.  Cardiovascular:     Rate and Rhythm: Normal rate and regular rhythm.     Pulses: Normal pulses.     Heart sounds: Normal heart sounds.  Pulmonary:     Effort: Pulmonary effort is normal.     Breath sounds: Normal breath sounds.  Musculoskeletal:     Cervical back: Normal range of motion.  Skin:    General: Skin is warm.  Neurological:     Mental Status: She is alert and oriented to person, place, and time.  Psychiatric:        Mood and Affect: Mood normal.        Behavior: Behavior normal.     BP 102/62 (BP Location: Left Arm, Patient Position: Sitting)   Pulse 88   Temp 98.1 F (36.7 C) (Temporal)   Ht 5\' 8"  (1.727 m)   Wt 172 lb (78 kg)   SpO2 98%   BMI 26.15 kg/m  Wt Readings from Last 3 Encounters:  10/27/19 172 lb (78 kg)  04/15/19 170 lb 4.8 oz (77.2 kg)  07/13/14 180 lb (81.6 kg)    Health Maintenance Due  Topic Date Due  . Hepatitis C Screening  Never done  . COVID-19 Vaccine (1) Never done  . HIV Screening  Never done  . TETANUS/TDAP  Never done  . PAP SMEAR-Modifier  Never done  . INFLUENZA VACCINE  10/03/2019    There are no preventive care reminders to display for this patient.   Lab Results  Component Value Date   TSH 2.750 08/24/2019   Lab Results  Component Value Date   WBC 10.2 08/24/2019   HGB 13.2 08/24/2019   HCT 38.6 08/24/2019   MCV 85 08/24/2019   PLT 254 08/24/2019   Lab Results  Component  Value Date   NA 141 08/24/2019   K 4.0 08/24/2019   CO2 20 08/24/2019   GLUCOSE 97  08/24/2019   BUN 13 08/24/2019   CREATININE 0.80 08/24/2019   BILITOT <0.2 08/24/2019   ALKPHOS 88 08/24/2019   AST 15 08/24/2019   ALT 9 08/24/2019   PROT 6.7 08/24/2019   ALBUMIN 4.1 08/24/2019   CALCIUM 9.5 08/24/2019   ANIONGAP 6 07/13/2014   Lab Results  Component Value Date   CHOL 200 (H) 08/24/2019   Lab Results  Component Value Date   HDL 35 (L) 08/24/2019   Lab Results  Component Value Date   LDLCALC 143 (H) 08/24/2019   Lab Results  Component Value Date   TRIG 122 08/24/2019   Lab Results  Component Value Date   CHOLHDL 5.7 (H) 08/24/2019      Assessment & Plan:  1. Generalized body aches Subjective fever, fatigue-took tylenol during lunch, no SOB, no cough-COVID test today-negative rapid, no vaccine Antibody test-advised home to rest Vandy Tsuchiya Mat Carne, MD

## 2019-10-27 NOTE — Addendum Note (Signed)
Addended by: Precious Reel on: 10/27/2019 02:12 PM   Modules accepted: Orders

## 2019-10-27 NOTE — Patient Instructions (Signed)
Home to rest Tylenol for body aches and fever every 6 hours Fluids

## 2019-10-28 LAB — SARS-COV-2, NAA 2 DAY TAT

## 2019-10-28 LAB — NOVEL CORONAVIRUS, NAA: SARS-CoV-2, NAA: NOT DETECTED

## 2019-10-30 ENCOUNTER — Other Ambulatory Visit: Payer: Self-pay | Admitting: Family Medicine

## 2019-10-30 MED ORDER — LORAZEPAM 0.5 MG PO TABS: 0.5000 mg | ORAL_TABLET | Freq: Every day | ORAL | 2 refills | Status: DC | PRN

## 2019-10-30 MED ORDER — ESCITALOPRAM OXALATE 10 MG PO TABS: 10.0000 mg | ORAL_TABLET | Freq: Every day | ORAL | 2 refills | Status: DC

## 2019-11-05 ENCOUNTER — Other Ambulatory Visit: Payer: Self-pay

## 2019-11-05 ENCOUNTER — Other Ambulatory Visit: Payer: Self-pay | Admitting: Family Medicine

## 2019-11-05 MED ORDER — ESCITALOPRAM OXALATE 10 MG PO TABS: 10.0000 mg | ORAL_TABLET | Freq: Every day | ORAL | 2 refills | Status: DC

## 2019-11-16 ENCOUNTER — Other Ambulatory Visit: Payer: Self-pay

## 2019-11-16 ENCOUNTER — Ambulatory Visit: Payer: Managed Care, Other (non HMO)

## 2019-11-16 DIAGNOSIS — Z23 Encounter for immunization: Secondary | ICD-10-CM

## 2019-11-16 NOTE — Progress Notes (Signed)
   Covid-19 Vaccination Clinic  Name:  Monica Banks    MRN: [id number] DOB: February 02, 1976  11/16/2019  Ms. [name] was observed post Covid-19 immunization for 15 minutes without incident. She was provided with Vaccine Information Sheet and instruction to access the V-Safe system.   Ms. [name] was instructed to call 911 with any severe reactions post vaccine: Marland Kitchen Difficulty breathing  . Swelling of face and throat  . A fast heartbeat  . A bad rash all over body  . Dizziness and weakness   Immunizations Administered    Name Date Dose VIS Date Route   Pfizer COVID-19 Vaccine 11/16/2019 10:00 AM 0.3 mL 04/28/2018 Intramuscular   Manufacturer: [employer], [employer]   Lot: [id number]   NDC: T3736699

## 2019-12-07 ENCOUNTER — Ambulatory Visit (INDEPENDENT_AMBULATORY_CARE_PROVIDER_SITE_OTHER): Payer: Managed Care, Other (non HMO)

## 2019-12-07 DIAGNOSIS — Z23 Encounter for immunization: Secondary | ICD-10-CM

## 2019-12-07 NOTE — Progress Notes (Signed)
   Covid-19 Vaccination Clinic  Name:  CARESSA SCEARCE    MRN: [id number] DOB: 01/21/1976  12/07/2019  Ms. Roszak was observed post Covid-19 immunization for 15 minutes without incident. She was provided with Vaccine Information Sheet and instruction to access the V-Safe system.   Ms. Rhoads was instructed to call 911 with any severe reactions post vaccine: Marland Kitchen Difficulty breathing  . Swelling of face and throat  . A fast heartbeat  . A bad rash all over body  . Dizziness and weakness   Immunizations Administered    Name Date Dose VIS Date Route   Pfizer COVID-19 Vaccine 12/07/2019 10:00 AM 0.3 mL 04/28/2018 Intramuscular   Manufacturer: [employer], [employer]   Lot: [id number]   NDC: T3736699

## 2019-12-29 ENCOUNTER — Ambulatory Visit (INDEPENDENT_AMBULATORY_CARE_PROVIDER_SITE_OTHER): Payer: Managed Care, Other (non HMO)

## 2019-12-29 DIAGNOSIS — Z23 Encounter for immunization: Secondary | ICD-10-CM

## 2020-01-04 ENCOUNTER — Other Ambulatory Visit: Payer: Self-pay

## 2020-01-04 ENCOUNTER — Ambulatory Visit (INDEPENDENT_AMBULATORY_CARE_PROVIDER_SITE_OTHER): Payer: Managed Care, Other (non HMO) | Admitting: Nurse Practitioner

## 2020-01-04 ENCOUNTER — Encounter: Payer: Self-pay | Admitting: Nurse Practitioner

## 2020-01-04 VITALS — BP 102/62 | HR 94 | Temp 97.6°F | Resp 16 | Ht 68.0 in | Wt 181.6 lb

## 2020-01-04 DIAGNOSIS — N3001 Acute cystitis with hematuria: Secondary | ICD-10-CM

## 2020-01-04 DIAGNOSIS — N3946 Mixed incontinence: Secondary | ICD-10-CM | POA: Diagnosis not present

## 2020-01-04 DIAGNOSIS — Z87442 Personal history of urinary calculi: Secondary | ICD-10-CM

## 2020-01-04 DIAGNOSIS — R319 Hematuria, unspecified: Secondary | ICD-10-CM | POA: Diagnosis not present

## 2020-01-04 LAB — POCT URINALYSIS DIP (CLINITEK)
Glucose, UA: NEGATIVE mg/dL
Nitrite, UA: NEGATIVE
Spec Grav, UA: 1.015 (ref 1.010–1.025)
Urobilinogen, UA: 0.2 E.U./dL
pH, UA: 5 (ref 5.0–8.0)

## 2020-01-04 MED ORDER — FLAVOXATE HCL 100 MG PO TABS: 100.0000 mg | ORAL_TABLET | Freq: Three times a day (TID) | ORAL | 0 refills | Status: DC | PRN

## 2020-01-04 MED ORDER — CIPROFLOXACIN HCL 500 MG PO TABS: 500.0000 mg | ORAL_TABLET | Freq: Two times a day (BID) | ORAL | 0 refills | Status: AC

## 2020-01-04 MED ORDER — CIPROFLOXACIN HCL 500 MG PO TABS: 500.0000 mg | ORAL_TABLET | Freq: Two times a day (BID) | ORAL | 0 refills | Status: DC

## 2020-01-04 NOTE — Progress Notes (Signed)
Acute Office Visit  Subjective:    Patient ID: Monica Banks, female    DOB: 03-Aug-1975, 44 y.o.   MRN: [id number]  Chief Complaint  Patient presents with  . Urinary Tract Infection    pressure, possible kidney, just started    HPI Patient is in today for dysuria for 2 weeks. Other symptoms include low grade fever, mild left flank pain, frequency, urgency, and suprapubic tenderness.She has also experienced night sweats over the past two months.  Denies aggravating or relieving factors or home treatments. She has experienced three episodes of renal stones over the past several years. Previous renal stones have required lithotripsy or surgical intervention. Not currently followed by urology.She has experienced stress and urge incontinence for several years despite performing Kegel exercises regularly. She has regular menstrual cycles;completed LMP last week.  Past Medical History:  Diagnosis Date  . Depression   . History of gestational diabetes   . History of kidney stones   . Other specified anxiety disorders   . Periodontal disease    on flagyl and clindamycin    Past Surgical History:  Procedure Laterality Date  . DILATION AND EVACUATION N/A 04/15/2019   Procedure: DILATATION AND EVACUATION;  Surgeon: Ranae Pila, MD;  Location: [facility];  Service: Gynecology;  Laterality: N/A;  . LITHOTRIPSY     x 1  . VOCAL CORD LATERALIZATION, ENDOSCOPIC APPROACH W/ MLB     vocal cord CYST removal    Family History  Problem Relation Age of Onset  . Hyperlipidemia Mother   . Diabetes Mother   . Diabetes Brother     Social History   Socioeconomic History  . Marital status: Divorced    Spouse name: Not on file  . Number of children: Not on file  . Years of education: Not on file  . Highest education level: Not on file  Occupational History  . Not on file  Tobacco Use  . Smoking status: Current Every Day Smoker    Packs/day: 1.00    Years: 29.00     Pack years: 29.00    Types: Cigarettes  . Smokeless tobacco: Never Used  Vaping Use  . Vaping Use: Never used  Substance and Sexual Activity  . Alcohol use: Never  . Drug use: Yes    Types: Marijuana    Comment: marijuana last used 04-12-2019 for nerves  . Sexual activity: Not on file  Other Topics Concern  . Not on file  Social History Narrative  . Not on file   Social Determinants of Health   Financial Resource Strain:   . Difficulty of Paying Living Expenses: Not on file  Food Insecurity:   . Worried About Programme researcher, broadcasting/film/video in the Last Year: Not on file  . Ran Out of Food in the Last Year: Not on file  Transportation Needs:   . Lack of Transportation (Medical): Not on file  . Lack of Transportation (Non-Medical): Not on file  Physical Activity:   . Days of Exercise per Week: Not on file  . Minutes of Exercise per Session: Not on file  Stress:   . Feeling of Stress : Not on file  Social Connections:   . Frequency of Communication with Friends and Family: Not on file  . Frequency of Social Gatherings with Friends and Family: Not on file  . Attends Religious Services: Not on file  . Active Member of Clubs or Organizations: Not on file  . Attends Club or  Organization Meetings: Not on file  . Marital Status: Not on file  Intimate Partner Violence:   . Fear of Current or Ex-Partner: Not on file  . Emotionally Abused: Not on file  . Physically Abused: Not on file  . Sexually Abused: Not on file    Outpatient Medications Prior to Visit  Medication Sig Dispense Refill  . acetaminophen (TYLENOL) 500 MG tablet Take 1,000 mg by mouth every 6 (six) hours as needed.    Marland Kitchen escitalopram (LEXAPRO) 10 MG tablet Take 1 tablet (10 mg total) by mouth daily. 30 tablet 2  . ibuprofen (ADVIL) 200 MG tablet Take 200 mg by mouth every 6 (six) hours as needed. Took 4 of  200 mg tabs    . LORazepam (ATIVAN) 0.5 MG tablet Take 1 tablet (0.5 mg total) by mouth daily as needed for anxiety. 30  tablet 2   No facility-administered medications prior to visit.    Allergies  Allergen Reactions  . Amoxicillin     Severe itching    Review of Systems  Constitutional: Positive for fatigue and fever. Negative for appetite change and unexpected weight change.  HENT: Negative for ear pain, sinus pressure, sinus pain and sore throat.   Respiratory: Negative for cough and shortness of breath.   Cardiovascular: Negative for chest pain and palpitations.  Gastrointestinal: Negative for constipation, diarrhea, nausea and vomiting.  Endocrine:       Night sweats  Genitourinary: Positive for decreased urine volume, dysuria, flank pain, frequency, hematuria, pelvic pain (suprapubic) and urgency. Negative for dyspareunia.  Skin: Negative.   Neurological: Negative for headaches.  Psychiatric/Behavioral: The patient is not nervous/anxious.        Objective:    Physical Exam Vitals reviewed.  Constitutional:      Appearance: Normal appearance.  HENT:     Head: Normocephalic.  Cardiovascular:     Rate and Rhythm: Normal rate and regular rhythm.     Pulses: Normal pulses.     Heart sounds: Normal heart sounds.  Pulmonary:     Effort: Pulmonary effort is normal.     Breath sounds: Normal breath sounds.  Abdominal:     General: Bowel sounds are normal.     Palpations: Abdomen is soft.     Tenderness: There is abdominal tenderness (suprapubic area). There is no right CVA tenderness or left CVA tenderness.  Skin:    General: Skin is warm and dry.     Capillary Refill: Capillary refill takes less than 2 seconds.  Neurological:     General: No focal deficit present.     Mental Status: She is alert and oriented to person, place, and time.  Psychiatric:        Mood and Affect: Mood normal.        Behavior: Behavior normal.     BP 102/62   Pulse 94   Temp 97.6 F (36.4 C)   Resp 16   Ht 5\' 8"  (1.727 m)   Wt 181 lb 9.6 oz (82.4 kg)   SpO2 98%   BMI 27.61 kg/m  Wt Readings  from Last 3 Encounters:  01/04/20 181 lb 9.6 oz (82.4 kg)  10/27/19 172 lb (78 kg)  04/15/19 170 lb 4.8 oz (77.2 kg)    Health Maintenance Due  Topic Date Due  . Hepatitis C Screening  Never done  . HIV Screening  Never done  . TETANUS/TDAP  Never done  . PAP SMEAR-Modifier  Never done    There are  no preventive care reminders to display for this patient.   Lab Results  Component Value Date   TSH 2.750 08/24/2019   Lab Results  Component Value Date   WBC 10.2 08/24/2019   HGB 13.2 08/24/2019   HCT 38.6 08/24/2019   MCV 85 08/24/2019   PLT 254 08/24/2019   Lab Results  Component Value Date   NA 141 08/24/2019   K 4.0 08/24/2019   CO2 20 08/24/2019   GLUCOSE 97 08/24/2019   BUN 13 08/24/2019   CREATININE 0.80 08/24/2019   BILITOT <0.2 08/24/2019   ALKPHOS 88 08/24/2019   AST 15 08/24/2019   ALT 9 08/24/2019   PROT 6.7 08/24/2019   ALBUMIN 4.1 08/24/2019   CALCIUM 9.5 08/24/2019   ANIONGAP 6 07/13/2014   Lab Results  Component Value Date   CHOL 200 (H) 08/24/2019   Lab Results  Component Value Date   HDL 35 (L) 08/24/2019   Lab Results  Component Value Date   LDLCALC 143 (H) 08/24/2019   Lab Results  Component Value Date   TRIG 122 08/24/2019   Lab Results  Component Value Date   CHOLHDL 5.7 (H) 08/24/2019        Assessment & Plan:   Problem List Items Addressed This Visit        Visit Diagnoses    Acute cystitis with hematuria    -  Primary   Relevant Medications   ciprofloxacin (CIPRO) 500 MG tablet   flavoxATE (URISPAS) 100 MG tablet   Other Relevant Orders   DG Abd 1 View   Hematuria, unspecified type       Relevant Medications   ciprofloxacin (CIPRO) 500 MG tablet   flavoxATE (URISPAS) 100 MG tablet   Other Relevant Orders   DG Abd 1 View   History of kidney stones       Relevant Medications   ciprofloxacin (CIPRO) 500 MG tablet   flavoxATE (URISPAS) 100 MG tablet       Meds ordered this encounter  Medications  .  DISCONTD: ciprofloxacin (CIPRO) 500 MG tablet    Sig: Take 1 tablet (500 mg total) by mouth 2 (two) times daily for 5 days.    Dispense:  10 tablet    Refill:  0    Order Specific Question:   Supervising Provider    AnswerBlane Ohara [id number]  . ciprofloxacin (CIPRO) 500 MG tablet    Sig: Take 1 tablet (500 mg total) by mouth 2 (two) times daily for 5 days.    Dispense:  10 tablet    Refill:  0    Order Specific Question:   Supervising Provider    AnswerBlane Ohara [id number]  . flavoxATE (URISPAS) 100 MG tablet    Sig: Take 1 tablet (100 mg total) by mouth 3 (three) times daily as needed for bladder spasms.    Dispense:  30 tablet    Refill:  0    Order Specific Question:   Supervising Provider    AnswerCorey Harold   Get x-ray tomorrow Start Cipro and Urispas tonight Push fluids  Urology consult pending x-ray results C  Janie Morning, NP

## 2020-01-04 NOTE — Patient Instructions (Addendum)
Get x-ray tomorrow Start Cipro and Urispas tonight Push fluids  Ciprofloxacin tablets What is this medicine? CIPROFLOXACIN (sip roe FLOX a sin) is a quinolone antibiotic. It is used to treat certain kinds of bacterial infections. It will not work for colds, flu, or other viral infections. This medicine may be used for other purposes; ask your health care provider or pharmacist if you have questions. COMMON BRAND NAME(S): Cipro What should I tell my health care provider before I take this medicine? They need to know if you have any of these conditions:  bone problems  diabetes  heart disease  high blood pressure  history of irregular heartbeat  history of low levels of potassium in the blood  joint problems  kidney disease  liver disease  mental illness  myasthenia gravis  seizures  tendon problems  tingling of the fingers or toes, or other nerve disorder  an unusual or allergic reaction to ciprofloxacin, other antibiotics or medicines, foods, dyes, or preservatives  pregnant or trying to get pregnant  breast-feeding How should I use this medicine? Take this medicine by mouth with a full glass of water. Follow the directions on the prescription label. You can take it with or without food. If it upsets your stomach, take it with food. Take your medicine at regular intervals. Do not take your medicine more often than directed. Take all of your medicine as directed even if you think you are better. Do not skip doses or stop your medicine early. Avoid antacids, aluminum, calcium, iron, magnesium, and zinc products for 6 hours before and 2 hours after taking a dose of this medicine. A special MedGuide will be given to you by the pharmacist with each prescription and refill. Be sure to read this information carefully each time. Talk to your pediatrician regarding the use of this medicine in children. Special care may be needed. Overdosage: If you think you have taken too  much of this medicine contact a poison control center or emergency room at once. NOTE: This medicine is only for you. Do not share this medicine with others. What if I miss a dose? If you miss a dose, take it as soon as you can. If it is almost time for your next dose, take only that dose. Do not take double or extra doses. What may interact with this medicine? Do not take this medicine with any of the following medications:  cisapride  dronedarone  flibanserin  lomitapide  pimozide  thioridazine  tizanidine This medicine may also interact with the following medications:  antacids  birth control pills  caffeine  certain medicines for diabetes, like glipizide, glyburide, or insulin  certain medicines that treat or prevent blood clots like warfarin  clozapine  cyclosporine  didanosine buffered tablets or powder  dofetilide  duloxetine  lanthanum carbonate  lidocaine  methotrexate  multivitamins  NSAIDS, medicines for pain and inflammation, like ibuprofen or naproxen  olanzapine  omeprazole  other medicines that prolong the QT interval (cause an abnormal heart rhythm)  phenytoin  probenecid  ropinirole  sevelamer  sildenafil  sucralfate  theophylline  ziprasidone  zolpidem This list may not describe all possible interactions. Give your health care provider a list of all the medicines, herbs, non-prescription drugs, or dietary supplements you use. Also tell them if you smoke, drink alcohol, or use illegal drugs. Some items may interact with your medicine. What should I watch for while using this medicine? Tell your doctor or health care provider if your symptoms  do not start to get better or if they get worse. This medicine may cause serious skin reactions. They can happen weeks to months after starting the medicine. Contact your health care provider right away if you notice fevers or flu-like symptoms with a rash. The rash may be red or  purple and then turn into blisters or peeling of the skin. Or, you might notice a red rash with swelling of the face, lips or lymph nodes in your neck or under your arms. Do not treat diarrhea with over the counter products. Contact your doctor if you have diarrhea that lasts more than 2 days or if it is severe and watery. Check with your doctor or health care provider if you get an attack of severe diarrhea, nausea and vomiting, or if you sweat a lot. The loss of too much body fluid can make it dangerous for you to take this medicine. This medicine may increase blood sugar. Ask your health care provider if changes in diet or medicines are needed if you have diabetes. You may get drowsy or dizzy. Do not drive, use machinery, or do anything that needs mental alertness until you know how this medicine affects you. Do not sit or stand up quickly, especially if you are an older patient. This reduces the risk of dizzy or fainting spells. This medicine can make you more sensitive to the sun. Keep out of the sun. If you cannot avoid being in the sun, wear protective clothing and use sunscreen. Do not use sun lamps or tanning beds/booths. What side effects may I notice from receiving this medicine? Side effects that you should report to your doctor or health care professional as soon as possible:  allergic reactions like skin rash or hives, swelling of the face, lips, or tongue  anxious  bloody or watery diarrhea  confusion  depressed mood  fast, irregular heartbeat  fever  hallucination, loss of contact with reality  joint, muscle, or tendon pain or swelling  loss of memory  pain, tingling, numbness in the hands or feet  redness, blistering, peeling or loosening of the skin, including inside the mouth  seizures  signs and symptoms of aortic dissection such as sudden chest, stomach, or back pain  signs and symptoms of high blood sugar such as being more thirsty or hungry or having to  urinate more than normal. You may also feel very tired or have blurry vision.  signs and symptoms of liver injury like dark yellow or brown urine; general ill feeling or flu-like symptoms; light-colored stools; loss of appetite; nausea; right upper belly pain; unusually weak or tired; yellowing of the eyes or skin  signs and symptoms of low blood sugar such as feeling anxious; confusion; dizziness; increased hunger; unusually weak or tired; sweating; shakiness; cold; irritable; headache; blurred vision; fast heartbeat; loss of consciousness; pale skin  suicidal thoughts or other mood changes  sunburn  unusually weak or tired Side effects that usually do not require medical attention (report to your doctor or health care professional if they continue or are bothersome):  dry mouth  headache  nausea  trouble sleeping This list may not describe all possible side effects. Call your doctor for medical advice about side effects. You may report side effects to FDA at 1-800-FDA-1088. Where should I keep my medicine? Keep out of the reach of children. Store at room temperature below 30 degrees C (86 degrees F). Keep container tightly closed. Throw away any unused medicine after the expiration  date. NOTE: This sheet is a summary. It may not cover all possible information. If you have questions about this medicine, talk to your doctor, pharmacist, or health care provider.  2020 Elsevier/Gold Standard (2018-05-21 11:26:08) Flavoxate tablets What is this medicine? FLAVOXATE (fla VOX ate) is used to relieve spasms in the urinary tract. This medicine may be used for other purposes; ask your health care provider or pharmacist if you have questions. COMMON BRAND NAME(S): Urispas What should I tell my health care provider before I take this medicine? They need to know if you have any of these conditions:  bleeding in the gastrointestinal tract  difficulty passing urine  glaucoma  stomach,  bowel or urinary tract obstruction  an unusual or allergic reaction to flavoxate, other medicines, foods, dyes, or preservatives  pregnant or trying to get pregnant  breast-feeding How should I use this medicine? Take this medicine by mouth with a glass of water. Follow the directions on the prescription label. Take your medicine at regular intervals. Do not take your medicine more often than directed. Talk to your pediatrician regarding the use of this medicine in children. While this drug may be prescribed for children as young as 12 years for selected conditions, precautions do apply. Overdosage: If you think you have taken too much of this medicine contact a poison control center or emergency room at once. NOTE: This medicine is only for you. Do not share this medicine with others. What if I miss a dose? If you miss a dose, take it as soon as you can. If it is almost time for your next dose, take only that dose. Do not take double or extra doses. What may interact with this medicine? Interactions are not expected. This list may not describe all possible interactions. Give your health care provider a list of all the medicines, herbs, non-prescription drugs, or dietary supplements you use. Also tell them if you smoke, drink alcohol, or use illegal drugs. Some items may interact with your medicine. What should I watch for while using this medicine? You may get drowsy or dizzy. Do not drive, use machinery, or do anything that needs mental alertness until you know how this medicine affects you. Do not stand or sit up quickly, especially if you are an older patient. This reduces the risk of dizzy or fainting spells. Alcohol can make you more drowsy. Avoid alcoholic drinks. Your mouth may get dry. Chewing sugarless gum or sucking hard candy, and drinking plenty of water may help. Contact your doctor if the problem does not go away or is severe. What side effects may I notice from receiving this  medicine? Side effects that you should report to your doctor or health care professional as soon as possible:  allergic reactions like skin rash, itching or hives, swelling of the face, lips, or tongue  changes is vision  dizziness, drowsiness  fever  irregular heartbeat  nervousness, confusion  unusually weak or tired  vomiting Side effects that usually do not require medical attention (report to your doctor or health care professional if they continue or are bothersome):  constipation  headache  nausea  stomach pain This list may not describe all possible side effects. Call your doctor for medical advice about side effects. You may report side effects to FDA at 1-800-FDA-1088. Where should I keep my medicine? Keep out of the reach of children. Store at room temperature between 15 and 30 degrees C (59 and 86 degrees F). Throw away any unused  medicine after the expiration date. NOTE: This sheet is a summary. It may not cover all possible information. If you have questions about this medicine, talk to your doctor, pharmacist, or health care provider.  2020 Elsevier/Gold Standard (2007-06-24 13:59:42) Urinary Tract Infection, Adult A urinary tract infection (UTI) is an infection of any part of the urinary tract. The urinary tract includes:  The kidneys.  The ureters.  The bladder.  The urethra. These organs make, store, and get rid of pee (urine) in the body. What are the causes? This is caused by germs (bacteria) in your genital area. These germs grow and cause swelling (inflammation) of your urinary tract. What increases the risk? You are more likely to develop this condition if:  You have a small, thin tube (catheter) to drain pee.  You cannot control when you pee or poop (incontinence).  You are female, and: ? You use these methods to prevent pregnancy:  A medicine that kills sperm (spermicide).  A device that blocks sperm (diaphragm). ? You have low  levels of a female hormone (estrogen). ? You are pregnant.  You have genes that add to your risk.  You are sexually active.  You take antibiotic medicines.  You have trouble peeing because of: ? A prostate that is bigger than normal, if you are female. ? A blockage in the part of your body that drains pee from the bladder (urethra). ? A kidney stone. ? A nerve condition that affects your bladder (neurogenic bladder). ? Not getting enough to drink. ? Not peeing often enough.  You have other conditions, such as: ? Diabetes. ? A weak disease-fighting system (immune system). ? Sickle cell disease. ? Gout. ? Injury of the spine. What are the signs or symptoms? Symptoms of this condition include:  Needing to pee right away (urgently).  Peeing often.  Peeing small amounts often.  Pain or burning when peeing.  Blood in the pee.  Pee that smells bad or not like normal.  Trouble peeing.  Pee that is cloudy.  Fluid coming from the vagina, if you are female.  Pain in the belly or lower back. Other symptoms include:  Throwing up (vomiting).  No urge to eat.  Feeling mixed up (confused).  Being tired and grouchy (irritable).  A fever.  Watery poop (diarrhea). How is this treated? This condition may be treated with:  Antibiotic medicine.  Other medicines.  Drinking enough water. Follow these instructions at home:  Medicines  Take over-the-counter and prescription medicines only as told by your doctor.  If you were prescribed an antibiotic medicine, take it as told by your doctor. Do not stop taking it even if you start to feel better. General instructions  Make sure you: ? Pee until your bladder is empty. ? Do not hold pee for a long time. ? Empty your bladder after sex. ? Wipe from front to back after pooping if you are a female. Use each tissue one time when you wipe.  Drink enough fluid to keep your pee pale yellow.  Keep all follow-up visits as  told by your doctor. This is important. Contact a doctor if:  You do not get better after 1-2 days.  Your symptoms go away and then come back. Get help right away if:  You have very bad back pain.  You have very bad pain in your lower belly.  You have a fever.  You are sick to your stomach (nauseous).  You are throwing up. Summary  A urinary tract infection (UTI) is an infection of any part of the urinary tract.  This condition is caused by germs in your genital area.  There are many risk factors for a UTI. These include having a small, thin tube to drain pee and not being able to control when you pee or poop.  Treatment includes antibiotic medicines for germs.  Drink enough fluid to keep your pee pale yellow. This information is not intended to replace advice given to you by your health care provider. Make sure you discuss any questions you have with your health care provider. Document Revised: 02/05/2018 Document Reviewed: 08/28/2017 Elsevier Patient Education  2020 [employer].

## 2020-01-07 LAB — URINE CULTURE

## 2020-01-11 NOTE — Progress Notes (Signed)
No renal or ureteral stone noted

## 2020-01-14 ENCOUNTER — Ambulatory Visit (INDEPENDENT_AMBULATORY_CARE_PROVIDER_SITE_OTHER): Payer: Managed Care, Other (non HMO) | Admitting: Nurse Practitioner

## 2020-01-14 ENCOUNTER — Encounter: Payer: Self-pay | Admitting: Nurse Practitioner

## 2020-01-14 ENCOUNTER — Other Ambulatory Visit: Payer: Self-pay

## 2020-01-14 VITALS — BP 102/62 | HR 94 | Temp 97.6°F | Resp 16 | Ht 68.0 in | Wt 181.0 lb

## 2020-01-14 DIAGNOSIS — N390 Urinary tract infection, site not specified: Secondary | ICD-10-CM

## 2020-01-14 DIAGNOSIS — N3001 Acute cystitis with hematuria: Secondary | ICD-10-CM

## 2020-01-14 DIAGNOSIS — R300 Dysuria: Secondary | ICD-10-CM | POA: Diagnosis not present

## 2020-01-14 DIAGNOSIS — R319 Hematuria, unspecified: Secondary | ICD-10-CM | POA: Diagnosis not present

## 2020-01-14 LAB — POCT URINALYSIS DIPSTICK
Bilirubin, UA: NEGATIVE
Glucose, UA: NEGATIVE
Ketones, UA: NEGATIVE
Nitrite, UA: NEGATIVE
Protein, UA: POSITIVE — AB
Spec Grav, UA: 1.02
Urobilinogen, UA: 0.2 U/dL
pH, UA: 6.5

## 2020-01-14 MED ORDER — PHENAZOPYRIDINE HCL 200 MG PO TABS: 200.0000 mg | ORAL_TABLET | Freq: Three times a day (TID) | ORAL | 0 refills | Status: DC | PRN

## 2020-01-14 MED ORDER — SULFAMETHOXAZOLE-TRIMETHOPRIM 800-160 MG PO TABS: 1.0000 | ORAL_TABLET | Freq: Two times a day (BID) | ORAL | 0 refills | Status: AC

## 2020-01-14 NOTE — Progress Notes (Signed)
Acute Office Visit  Subjective:    Patient ID: Monica Banks, female    DOB: 16-Mar-1975, 44 y.o.   MRN: [id number]  Chief Complaint  Patient presents with  . Urinary Tract Infection    HPI Patient is in for dysuria and suprapubic pressure/tenderness.  Other symptoms include  frequency, urgency, decreased urine output, pressure when she urinates and suprapubic tenderness. Denies fever, CVA tenderness, or gross hematuria.  Denies aggravating or relieving factors or home treatments. She was treated for acute cystitis on 01/04/20 with CIPRO. Urine culture revealed normal vaginal flora. She has history of kidney stones. Not currently followed by urology.   Past Medical History:  Diagnosis Date  . Depression   . History of gestational diabetes   . History of kidney stones   . Other specified anxiety disorders   . Periodontal disease    on flagyl and clindamycin    Past Surgical History:  Procedure Laterality Date  . DILATION AND EVACUATION N/A 04/15/2019   Procedure: DILATATION AND EVACUATION;  Surgeon: Ranae Pila, MD;  Location: [facility];  Service: Gynecology;  Laterality: N/A;  . LITHOTRIPSY     x 1  . VOCAL CORD LATERALIZATION, ENDOSCOPIC APPROACH W/ MLB     vocal cord CYST removal    Family History  Problem Relation Age of Onset  . Hyperlipidemia Mother   . Diabetes Mother   . Diabetes Brother     Social History   Socioeconomic History  . Marital status: Divorced    Spouse name: Not on file  . Number of children: Not on file  . Years of education: Not on file  . Highest education level: Not on file  Occupational History  . Not on file  Tobacco Use  . Smoking status: Current Every Day Smoker    Packs/day: 1.00    Years: 29.00    Pack years: 29.00    Types: Cigarettes  . Smokeless tobacco: Never Used  Vaping Use  . Vaping Use: Never used  Substance and Sexual Activity  . Alcohol use: Never  . Drug use: Yes    Types: Marijuana      Comment: marijuana last used 04-12-2019 for nerves  . Sexual activity: Not on file  Other Topics Concern  . Not on file  Social History Narrative  . Not on file   Social Determinants of Health   Financial Resource Strain:   . Difficulty of Paying Living Expenses: Not on file  Food Insecurity:   . Worried About Programme researcher, broadcasting/film/video in the Last Year: Not on file  . Ran Out of Food in the Last Year: Not on file  Transportation Needs:   . Lack of Transportation (Medical): Not on file  . Lack of Transportation (Non-Medical): Not on file  Physical Activity:   . Days of Exercise per Week: Not on file  . Minutes of Exercise per Session: Not on file  Stress:   . Feeling of Stress : Not on file  Social Connections:   . Frequency of Communication with Friends and Family: Not on file  . Frequency of Social Gatherings with Friends and Family: Not on file  . Attends Religious Services: Not on file  . Active Member of Clubs or Organizations: Not on file  . Attends Banker Meetings: Not on file  . Marital Status: Not on file  Intimate Partner Violence:   . Fear of Current or Ex-Partner: Not on file  . Emotionally  Abused: Not on file  . Physically Abused: Not on file  . Sexually Abused: Not on file    Outpatient Medications Prior to Visit  Medication Sig Dispense Refill  . acetaminophen (TYLENOL) 500 MG tablet Take 1,000 mg by mouth every 6 (six) hours as needed.    Marland Kitchen escitalopram (LEXAPRO) 10 MG tablet Take 1 tablet (10 mg total) by mouth daily. 30 tablet 2  . flavoxATE (URISPAS) 100 MG tablet Take 1 tablet (100 mg total) by mouth 3 (three) times daily as needed for bladder spasms. 30 tablet 0  . ibuprofen (ADVIL) 200 MG tablet Take 200 mg by mouth every 6 (six) hours as needed. Took 4 of  200 mg tabs    . LORazepam (ATIVAN) 0.5 MG tablet Take 1 tablet (0.5 mg total) by mouth daily as needed for anxiety. 30 tablet 2   No facility-administered medications prior to visit.     Allergies  Allergen Reactions  . Amoxicillin     Severe itching    Review of Systems  Constitutional: Positive for fatigue. Negative for appetite change, fever and unexpected weight change.  HENT: Negative for ear pain, sinus pressure, sinus pain and sore throat.   Respiratory: Negative for cough and shortness of breath.   Cardiovascular: Negative for chest pain and palpitations.  Gastrointestinal: Negative for constipation, diarrhea, nausea and vomiting.  Endocrine:       Night sweats  Genitourinary: Positive for decreased urine volume, dysuria, frequency, pelvic pain (suprapubic pressure) and urgency. Negative for dyspareunia and flank pain.  Musculoskeletal: Positive for myalgias. Negative for arthralgias and back pain.  Skin: Negative.  Negative for rash.  Neurological: Negative for headaches.  Psychiatric/Behavioral: The patient is not nervous/anxious.        Objective:    Physical Exam Vitals reviewed.  Constitutional:      Appearance: Normal appearance.  HENT:     Head: Normocephalic.  Cardiovascular:     Rate and Rhythm: Normal rate and regular rhythm.     Pulses: Normal pulses.     Heart sounds: Normal heart sounds.  Pulmonary:     Effort: Pulmonary effort is normal.     Breath sounds: Normal breath sounds.  Abdominal:     General: Bowel sounds are normal.     Palpations: Abdomen is soft.     Tenderness: There is abdominal tenderness (suprapubic area). There is no right CVA tenderness or left CVA tenderness.  Skin:    General: Skin is warm and dry.     Capillary Refill: Capillary refill takes less than 2 seconds.  Neurological:     General: No focal deficit present.     Mental Status: She is alert and oriented to person, place, and time.  Psychiatric:        Mood and Affect: Mood normal.        Behavior: Behavior normal.     BP 102/62 (BP Location: Left Arm, Patient Position: Sitting)   Pulse 94   Temp 97.6 F (36.4 C) (Temporal)   Resp 16   Ht  5\' 8"  (1.727 m)   Wt 181 lb (82.1 kg)   BMI 27.52 kg/m  Wt Readings from Last 3 Encounters:  01/14/20 181 lb (82.1 kg)  01/04/20 181 lb 9.6 oz (82.4 kg)  10/27/19 172 lb (78 kg)    Health Maintenance Due  Topic Date Due  . Hepatitis C Screening  Never done  . HIV Screening  Never done  . TETANUS/TDAP  Never done  .  PAP SMEAR-Modifier  Never done    There are no preventive care reminders to display for this patient.   Lab Results  Component Value Date   TSH 2.750 08/24/2019   Lab Results  Component Value Date   WBC 10.2 08/24/2019   HGB 13.2 08/24/2019   HCT 38.6 08/24/2019   MCV 85 08/24/2019   PLT 254 08/24/2019   Lab Results  Component Value Date   NA 141 08/24/2019   K 4.0 08/24/2019   CO2 20 08/24/2019   GLUCOSE 97 08/24/2019   BUN 13 08/24/2019   CREATININE 0.80 08/24/2019   BILITOT <0.2 08/24/2019   ALKPHOS 88 08/24/2019   AST 15 08/24/2019   ALT 9 08/24/2019   PROT 6.7 08/24/2019   ALBUMIN 4.1 08/24/2019   CALCIUM 9.5 08/24/2019   ANIONGAP 6 07/13/2014   Lab Results  Component Value Date   CHOL 200 (H) 08/24/2019   Lab Results  Component Value Date   HDL 35 (L) 08/24/2019   Lab Results  Component Value Date   LDLCALC 143 (H) 08/24/2019   Lab Results  Component Value Date   TRIG 122 08/24/2019   Lab Results  Component Value Date   CHOLHDL 5.7 (H) 08/24/2019        Assessment & Plan:   Problem List Items Addressed This Visit           1. Acute cystitis with hematuria - sulfamethoxazole-trimethoprim (BACTRIM DS) 800-160 MG tablet; Take 1 tablet by mouth 2 (two) times daily for 5 days.  Dispense: 10 tablet; Refill: 0  2. Hematuria, unspecified type - Urine Culture - POCT urinalysis dipstick - Ambulatory referral to Urology  3. Dysuria - phenazopyridine (PYRIDIUM) 200 MG tablet; Take 1 tablet (200 mg total) by mouth 3 (three) times daily as needed for pain.  Dispense: 10 tablet; Refill: 0  4. Recurrent UTI - Ambulatory  referral to Urology                          Meds ordered this encounter  Medications  . sulfamethoxazole-trimethoprim (BACTRIM DS) 800-160 MG tablet    Sig: Take 1 tablet by mouth 2 (two) times daily for 5 days.    Dispense:  10 tablet    Refill:  0    Order Specific Question:   Supervising Provider    AnswerBlane Ohara [id number]  . phenazopyridine (PYRIDIUM) 200 MG tablet    Sig: Take 1 tablet (200 mg total) by mouth 3 (three) times daily as needed for pain.    Dispense:  10 tablet    Refill:  0    Order Specific Question:   Supervising Provider    Answer:   Corey Harold    Bactrim twice daily for 5 days Pyridium as needed for bladder pressure/pain Push fluids Referral to Prescilla Sours, FNP at [facility]-we will call you with appointment    Flonnie Hailstone, DNP [facility] [place], [place] [phone]

## 2020-01-14 NOTE — Patient Instructions (Addendum)
Bactrim twice daily for 5 days Pyridium as needed for bladder pressure/pain Push fluids Referral to Prescilla Sours, FNP at [facility]-we will call you with appointment  Hematuria, Adult Hematuria is blood in the urine. Blood may be visible in the urine, or it may be identified with a test. This condition can be caused by infections of the bladder, urethra, kidney, or prostate. Other possible causes include:  Kidney stones.  Cancer of the urinary tract.  Too much calcium in the urine.  Conditions that are passed from parent to child (inherited conditions).  Exercise that requires a lot of energy. Infections can usually be treated with medicine, and a kidney stone usually will pass through your urine. If neither of these is the cause of your hematuria, more tests may be needed to identify the cause of your symptoms. It is very important to tell your health care provider about any blood in your urine, even if it is painless or the blood stops without treatment. Blood in the urine, when it happens and then stops and then happens again, can be a symptom of a very serious condition, including cancer. There is no pain in the initial stages of many urinary cancers. Follow these instructions at home: Medicines  Take over-the-counter and prescription medicines only as told by your health care provider.  If you were prescribed an antibiotic medicine, take it as told by your health care provider. Do not stop taking the antibiotic even if you start to feel better. Eating and drinking  Drink enough fluid to keep your urine clear or pale yellow. It is recommended that you drink 3-4 quarts (2.8-3.8 L) a day. If you have been diagnosed with an infection, it is recommended that you drink cranberry juice in addition to large amounts of water.  Avoid caffeine, tea, and carbonated beverages. These tend to irritate the bladder.  Avoid alcohol because it may irritate the prostate (men). General  instructions  If you have been diagnosed with a kidney stone, follow your health care provider's instructions about straining your urine to catch the stone.  Empty your bladder often. Avoid holding urine for long periods of time.  If you are female: ? After a bowel movement, wipe from front to back and use each piece of toilet paper only once. ? Empty your bladder before and after sex.  Pay attention to any changes in your symptoms. Tell your health care provider about any changes or any new symptoms.  It is your responsibility to get your test results. Ask your health care provider, or the department performing the test, when your results will be ready.  Keep all follow-up visits as told by your health care provider. This is important. Contact a health care provider if:  You develop back pain.  You have a fever.  You have nausea or vomiting.  Your symptoms do not improve after 3 days.  Your symptoms get worse. Get help right away if:  You develop severe vomiting and are unable take medicine without vomiting.  You develop severe pain in your back or abdomen even though you are taking medicine.  You pass a large amount of blood in your urine.  You pass blood clots in your urine.  You feel very weak or like you might faint.  You faint. Summary  Hematuria is blood in the urine. It has many possible causes.  It is very important that you tell your health care provider about any blood in your urine, even if  it is painless or the blood stops without treatment.  Take over-the-counter and prescription medicines only as told by your health care provider.  Drink enough fluid to keep your urine clear or pale yellow. This information is not intended to replace advice given to you by your health care provider. Make sure you discuss any questions you have with your health care provider. Document Revised: 07/15/2018 Document Reviewed: 03/23/2016 Elsevier Patient Education  2020  [employer] Urinary Tract Infection, Adult A urinary tract infection (UTI) is an infection of any part of the urinary tract. The urinary tract includes:  The kidneys.  The ureters.  The bladder.  The urethra. These organs make, store, and get rid of pee (urine) in the body. What are the causes? This is caused by germs (bacteria) in your genital area. These germs grow and cause swelling (inflammation) of your urinary tract. What increases the risk? You are more likely to develop this condition if:  You have a small, thin tube (catheter) to drain pee.  You cannot control when you pee or poop (incontinence).  You are female, and: ? You use these methods to prevent pregnancy:  A medicine that kills sperm (spermicide).  A device that blocks sperm (diaphragm). ? You have low levels of a female hormone (estrogen). ? You are pregnant.  You have genes that add to your risk.  You are sexually active.  You take antibiotic medicines.  You have trouble peeing because of: ? A prostate that is bigger than normal, if you are female. ? A blockage in the part of your body that drains pee from the bladder (urethra). ? A kidney stone. ? A nerve condition that affects your bladder (neurogenic bladder). ? Not getting enough to drink. ? Not peeing often enough.  You have other conditions, such as: ? Diabetes. ? A weak disease-fighting system (immune system). ? Sickle cell disease. ? Gout. ? Injury of the spine. What are the signs or symptoms? Symptoms of this condition include:  Needing to pee right away (urgently).  Peeing often.  Peeing small amounts often.  Pain or burning when peeing.  Blood in the pee.  Pee that smells bad or not like normal.  Trouble peeing.  Pee that is cloudy.  Fluid coming from the vagina, if you are female.  Pain in the belly or lower back. Other symptoms include:  Throwing up (vomiting).  No urge to eat.  Feeling mixed up  (confused).  Being tired and grouchy (irritable).  A fever.  Watery poop (diarrhea). How is this treated? This condition may be treated with:  Antibiotic medicine.  Other medicines.  Drinking enough water. Follow these instructions at home:  Medicines  Take over-the-counter and prescription medicines only as told by your doctor.  If you were prescribed an antibiotic medicine, take it as told by your doctor. Do not stop taking it even if you start to feel better. General instructions  Make sure you: ? Pee until your bladder is empty. ? Do not hold pee for a long time. ? Empty your bladder after sex. ? Wipe from front to back after pooping if you are a female. Use each tissue one time when you wipe.  Drink enough fluid to keep your pee pale yellow.  Keep all follow-up visits as told by your doctor. This is important. Contact a doctor if:  You do not get better after 1-2 days.  Your symptoms go away and then come back. Get help right away if:  You have very bad back pain.  You have very bad pain in your lower belly. About Cystocele  Overview  The pelvic organs, including the bladder, are normally supported by pelvic floor muscles and ligaments.  When these muscles and ligaments are stretched, weakened or torn, the wall between the bladder and the vagina sags or herniates causing a prolapse, sometimes called a cystocele.  This condition may cause discomfort and problems with emptying the bladder.  It can be present in various stages.  Some people are not aware of the changes.  Others may notice changes at the vaginal opening or a feeling of the bladder dropping outside the body.  Causes of a Cystocele  A cystocele is usually caused by muscle straining or stretching during childbirth.  In addition, cystocele is more common after menopause, because the hormone estrogen helps keep the elastic tissues around the pelvic organs strong.  A cystocele is more likely to occur  when levels of estrogen decrease.  Other causes include: heavy lifting, chronic coughing, previous pelvic surgery and obesity.  Symptoms  A bladder that has dropped from its normal position may cause: unwanted urine leakage (stress incontinence), frequent urination or urge to urinate, incomplete emptying of the bladder (not feeling bladder relief after emptying), pain or discomfort in the vagina, pelvis, groin, lower back or lower abdomen and frequent urinary tract infections.  Mild cases may not cause any symptoms.  Treatment Options Pelvic floor (Kegel) exercises:  Strength training the muscles in your genital area Behavioral changes: Treating and preventing constipation, taking time to empty your bladder properly, learning to lift properly and/or avoid heavy lifting when possible, stopping smoking, avoiding weight gain and treating a chronic cough or bronchitis. A pessary: A vaginal support device is sometimes used to help pelvic support caused by muscle and ligament changes. Surgery: Surgical repair may be necessary if symptoms cannot be managed with exercise, behavioral changes and a pessary.  Surgery is usually considered for severe cases.    2007, [employer]You have a fever.  You are sick to your stomach (nauseous).  You are throwing up. Summary  A urinary tract infection (UTI) is an infection of any part of the urinary tract.  This condition is caused by germs in your genital area.  There are many risk factors for a UTI. These include having a small, thin tube to drain pee and not being able to control when you pee or poop.  Treatment includes antibiotic medicines for germs.  Drink enough fluid to keep your pee pale yellow. This information is not intended to replace advice given to you by your health care provider. Make sure you discuss any questions you have with your health care provider. Document Revised: 02/05/2018 Document Reviewed: 08/28/2017 Elsevier  Patient Education  2020 Elsevier Inc. Phenazopyridine tablets What is this medicine? PHENAZOPYRIDINE (fen az oh PEER i deen) is a pain reliever. It is used to stop the pain, burning, or discomfort caused by infection or irritation of the urinary tract. This medicine is not an antibiotic. It will not cure a urinary tract infection. This medicine may be used for other purposes; ask your health care provider or pharmacist if you have questions. COMMON BRAND NAME(S): AZO, Azo-100, Azo-Gesic, Azo-Septic, Azo-Standard, Phenazo, Prodium, Pyridium, Urinary Analgesic, Uristat, Uristat Ultra What should I tell my health care provider before I take this medicine? They need to know if you have any of these conditions:  glucose-6-phosphate dehydrogenase (G6PD) deficiency  kidney disease  an unusual or  allergic reaction to phenazopyridine, other medicines, foods, dyes, or preservatives  pregnant or trying to get pregnant  breast-feeding How should I use this medicine? Take this medicine by mouth with a glass of water. Follow the directions on the prescription label. Take after meals. Take your doses at regular intervals. Do not take your medicine more often than directed. Do not skip doses or stop your medicine early even if you feel better. Do not stop taking except on your doctor's advice. Talk to your pediatrician regarding the use of this medicine in children. Special care may be needed. Overdosage: If you think you have taken too much of this medicine contact a poison control center or emergency room at once. NOTE: This medicine is only for you. Do not share this medicine with others. What if I miss a dose? If you miss a dose, take it as soon as you can. If it is almost time for your next dose, take only that dose. Do not take double or extra doses. What may interact with this medicine? Interactions are not expected. This list may not describe all possible interactions. Give your health care  provider a list of all the medicines, herbs, non-prescription drugs, or dietary supplements you use. Also tell them if you smoke, drink alcohol, or use illegal drugs. Some items may interact with your medicine. What should I watch for while using this medicine? Tell your doctor or health care professional if your symptoms do not improve or if they get worse. This medicine colors body fluids red. This effect is harmless and will go away after you are done taking the medicine. It will change urine to an dark orange or red color. The red color may stain clothing. Soft contact lenses may become permanently stained. It is best not to wear soft contact lenses while taking this medicine. If you are diabetic you may get a false positive result for sugar in your urine. Talk to your health care provider. What side effects may I notice from receiving this medicine? Side effects that you should report to your doctor or health care professional as soon as possible:  allergic reactions like skin rash, itching or hives, swelling of the face, lips, or tongue  blue or purple color of the skin  difficulty breathing  fever  less urine  unusual bleeding, bruising  unusual tired, weak  vomiting  yellowing of the eyes or skin Side effects that usually do not require medical attention (report to your doctor or health care professional if they continue or are bothersome):  dark urine  headache  stomach upset This list may not describe all possible side effects. Call your doctor for medical advice about side effects. You may report side effects to FDA at 1-800-FDA-1088. Where should I keep my medicine? Keep out of the reach of children. Store at room temperature between 15 and 30 degrees C (59 and 86 degrees F). Protect from light and moisture. Throw away any unused medicine after the expiration date. NOTE: This sheet is a summary. It may not cover all possible information. If you have questions about  this medicine, talk to your doctor, pharmacist, or health care provider.  2020 Elsevier/Gold Standard (2007-09-17 11:04:07) Sulfamethoxazole; Trimethoprim, SMX-TMP tablets What is this medicine? SULFAMETHOXAZOLE; TRIMETHOPRIM or SMX-TMP (suhl fuh meth OK suh zohl; trye METH oh prim) is a combination of a sulfonamide antibiotic and a second antibiotic, trimethoprim. It is used to treat or prevent certain kinds of bacterial infections. It will not work  for colds, flu, or other viral infections. This medicine may be used for other purposes; ask your health care provider or pharmacist if you have questions. COMMON BRAND NAME(S): Bacter-Aid DS, Bactrim, Bactrim DS, Septra, Septra DS What should I tell my health care provider before I take this medicine? They need to know if you have any of these conditions:  G6PD deficiency  HIV or AIDS  kidney disease  liver disease  low platelets  low red blood cell counts  poor nutrition  stomach or intestine problems like colitis  thyroid disease  an unusual or allergic reaction to sulfamethoxazole, trimethoprim, sulfa drugs, other drugs, foods, dyes, or preservatives  pregnant or trying to get pregnant  breast-feeding How should I use this medicine? Take this medicine by mouth with a glass of water. Follow the directions on the prescription label. Take your medicine at regular intervals. Do not take it more often than directed. Take all of your medicine as directed even if you think you are better. Do not skip doses or stop your medicine early. Talk to your pediatrician regarding the use of this medicine in children. While this drug may be prescribed for children as young as 2 months for selected conditions, precautions do apply. Overdosage: If you think you have taken too much of this medicine contact a poison control center or emergency room at once. NOTE: This medicine is only for you. Do not share this medicine with others. What if I miss a  dose? If you miss a dose, take it as soon as you can. If it is almost time for your next dose, take only that dose. Do not take double or extra doses. What may interact with this medicine? Do not take this medicine with any of the following medications:  dofetilide This medicine may also interact with the following medications:  amantadine  birth control pills  certain medicines for blood pressure, heart disease  certain medicines for depression, like amitriptyline  certain medicines for diabetes, like glipizide or glyburide  certain medicines that treat or prevent blood clots like warfarin  cyclosporine  digoxin  diuretics  indomethacin  methotrexate  phenytoin  procainamide  pyrimethamine  zidovudine This list may not describe all possible interactions. Give your health care provider a list of all the medicines, herbs, non-prescription drugs, or dietary supplements you use. Also tell them if you smoke, drink alcohol, or use illegal drugs. Some items may interact with your medicine. What should I watch for while using this medicine? Tell your health care provider if your symptoms do not start to get better or if they get worse. Do not treat diarrhea with over the counter products. Contact your health care provider if you have diarrhea that lasts more than 2 days or if it is severe and watery. This drug may cause serious skin reactions. They can happen weeks to months after starting the drug. Contact your health care provider right away if you notice fevers or flu-like symptoms with a rash. The rash may be red or purple and then turn into blisters or peeling of the skin. Or, you might notice a red rash with swelling of the face, lips or lymph nodes in your neck or under your arms. This drug can make you more sensitive to the sun. Keep out of the sun. If you cannot avoid being in the sun, wear protective clothing and sunscreen. Do not use sun lamps or tanning beds/booths. Be  careful brushing or flossing your teeth or using  a toothpick because you may get an infection or bleed more easily. If you have any dental work done, tell your dentist you are receiving this drug. What side effects may I notice from receiving this medicine? Side effects that you should report to your doctor or health care professional as soon as possible:  allergic reactions (skin rash, itching or hives; swelling of the face, lips, or tongue)  bloody or watery diarrhea  heartbeat rhythm changes (trouble breathing; chest pain; dizziness; fast, irregular heartbeat; feeling faint or lightheaded, falls,)  fever  high potassium levels (chest pain; fast, irregular heartbeat; muscle weakness)  kidney injury (trouble passing urine or change in the amount of urine)  low blood sugar (feeling anxious; confusion; dizziness; increased hunger; unusually weak or tired; increased sweating; shakiness; cold, clammy skin; irritable; headache; blurred vision; fast heartbeat; loss of consciousness)  low red blood cell counts (trouble breathing; feeling faint; lightheaded, falls; unusually weak or tired)  rash, fever, and swollen lymph nodes  redness, blistering, peeling, or loosening of the skin, including inside the mouth  unusual bruising or bleeding Side effects that usually do not require medical attention (report to your doctor or health care professional if they continue or are bothersome):  loss of appetite  nausea  vomiting This list may not describe all possible side effects. Call your doctor for medical advice about side effects. You may report side effects to FDA at 1-800-FDA-1088. Where should I keep my medicine? Keep out of the reach of children. Store between 15 and 25 degrees C (59 to 77 degrees F). Protect from light. Keep the container tightly closed. Throw away any unused drug after the expiration date. NOTE: This sheet is a summary. It may not cover all possible information. If you  have questions about this medicine, talk to your doctor, pharmacist, or health care provider.  2020 Elsevier/Gold Standard (2018-11-06 12:53:51)

## 2020-01-20 LAB — URINE CULTURE

## 2020-01-21 ENCOUNTER — Other Ambulatory Visit: Payer: Self-pay | Admitting: Family Medicine

## 2020-01-21 MED ORDER — ESCITALOPRAM OXALATE 20 MG PO TABS: 20.0000 mg | ORAL_TABLET | Freq: Every day | ORAL | 1 refills | Status: DC

## 2020-01-21 MED ORDER — VARENICLINE TARTRATE 0.5 MG PO TABS: ORAL_TABLET | ORAL | 0 refills | Status: AC

## 2020-01-21 NOTE — Progress Notes (Signed)
Discussed lexapro. Increaed to 20 mg once daily.  Sent rx for chantix to start. Education given.

## 2020-02-04 ENCOUNTER — Other Ambulatory Visit: Payer: Self-pay | Admitting: Nurse Practitioner

## 2020-02-04 MED ORDER — CYCLOBENZAPRINE HCL 10 MG PO TABS: 10.0000 mg | ORAL_TABLET | Freq: Three times a day (TID) | ORAL | 0 refills | Status: DC | PRN

## 2020-02-04 MED ORDER — MELOXICAM 7.5 MG PO TABS: 7.5000 mg | ORAL_TABLET | Freq: Every day | ORAL | 1 refills | Status: DC

## 2020-02-21 ENCOUNTER — Other Ambulatory Visit: Payer: Self-pay | Admitting: Family Medicine

## 2020-02-21 MED ORDER — ESCITALOPRAM OXALATE 20 MG PO TABS
20.0000 mg | ORAL_TABLET | Freq: Every day | ORAL | 1 refills | Status: DC
Start: 2020-02-21 — End: 2020-09-07

## 2020-03-23 ENCOUNTER — Telehealth (INDEPENDENT_AMBULATORY_CARE_PROVIDER_SITE_OTHER): Payer: Managed Care, Other (non HMO) | Admitting: Family Medicine

## 2020-03-23 ENCOUNTER — Encounter: Payer: Self-pay | Admitting: Family Medicine

## 2020-03-23 DIAGNOSIS — R519 Headache, unspecified: Secondary | ICD-10-CM

## 2020-03-23 DIAGNOSIS — J029 Acute pharyngitis, unspecified: Secondary | ICD-10-CM

## 2020-03-23 DIAGNOSIS — R5383 Other fatigue: Secondary | ICD-10-CM | POA: Diagnosis not present

## 2020-03-23 DIAGNOSIS — U071 COVID-19: Secondary | ICD-10-CM

## 2020-03-23 DIAGNOSIS — J020 Streptococcal pharyngitis: Secondary | ICD-10-CM

## 2020-03-23 LAB — POCT RAPID STREP A (OFFICE): Rapid Strep A Screen: POSITIVE — AB

## 2020-03-23 LAB — POCT INFLUENZA A/B
Influenza A, POC: NEGATIVE
Influenza B, POC: NEGATIVE

## 2020-03-23 LAB — POC COVID19 BINAXNOW: SARS Coronavirus 2 Ag: POSITIVE — AB

## 2020-03-23 MED ORDER — PROMETHAZINE HCL 25 MG PO TABS: 25.0000 mg | ORAL_TABLET | Freq: Three times a day (TID) | ORAL | 0 refills | Status: DC | PRN

## 2020-03-23 MED ORDER — AZITHROMYCIN 250 MG PO TABS: ORAL_TABLET | ORAL | 0 refills | Status: DC

## 2020-03-23 NOTE — Progress Notes (Signed)
Acute Office Visit  Subjective:    Patient ID: Monica Banks, female    DOB: [birth year], 45 y.o.   MRN: [id number]  Chief Complaint  Patient presents with  . Fatigue    HPI Patient is in today for malaise, fatigue, headache, burning behind eyes, sore throat, and nausea. No chest pain, sob, earaches.  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  She has had to have her Covid vaccinations.  She also had Covid in August 2021.   Past Medical History:  Diagnosis Date  . Depression   . History of gestational diabetes   . History of kidney stones   . Other specified anxiety disorders   . Periodontal disease    on flagyl and clindamycin    Past Surgical History:  Procedure Laterality Date  . DILATION AND EVACUATION N/A 04/15/2019   Procedure: DILATATION AND EVACUATION;  Surgeon: Ranae Pila, MD;  Location: [facility];  Service: Gynecology;  Laterality: N/A;  . LITHOTRIPSY     x 1  . VOCAL CORD LATERALIZATION, ENDOSCOPIC APPROACH W/ MLB     vocal cord CYST removal    Family History  Problem Relation Age of Onset  . Hyperlipidemia Mother   . Diabetes Mother   . Diabetes Brother     Social History   Socioeconomic History  . Marital status: Divorced    Spouse name: Not on file  . Number of children: Not on file  . Years of education: Not on file  . Highest education level: Not on file  Occupational History  . Not on file  Tobacco Use  . Smoking status: Current Every Day Smoker    Packs/day: 1.00    Years: 29.00    Pack years: 29.00    Types: Cigarettes  . Smokeless tobacco: Never Used  Vaping Use  . Vaping Use: Never used  Substance and Sexual Activity  . Alcohol use: Never  . Drug use: Yes    Types: Marijuana    Comment: marijuana last used 04-12-2019 for nerves  . Sexual activity: Not on file  Other Topics Concern  . Not on file  Social History Narrative  . Not on file   Social Determinants of Health   Financial Resource Strain: Not on file   Food Insecurity: Not on file  Transportation Needs: Not on file  Physical Activity: Not on file  Stress: Not on file  Social Connections: Not on file  Intimate Partner Violence: Not on file    Outpatient Medications Prior to Visit  Medication Sig Dispense Refill  . acetaminophen (TYLENOL) 500 MG tablet Take 1,000 mg by mouth every 6 (six) hours as needed.    . cyclobenzaprine (FLEXERIL) 10 MG tablet Take 1 tablet (10 mg total) by mouth 3 (three) times daily as needed for muscle spasms. 90 tablet 0  . escitalopram (LEXAPRO) 20 MG tablet Take 1 tablet (20 mg total) by mouth daily. 90 tablet 1  . flavoxATE (URISPAS) 100 MG tablet Take 1 tablet (100 mg total) by mouth 3 (three) times daily as needed for bladder spasms. 30 tablet 0  . ibuprofen (ADVIL) 200 MG tablet Take 200 mg by mouth every 6 (six) hours as needed. Took 4 of  200 mg tabs    . LORazepam (ATIVAN) 0.5 MG tablet Take 1 tablet (0.5 mg total) by mouth daily as needed for anxiety. 30 tablet 2  . meloxicam (MOBIC) 7.5 MG tablet Take 1 tablet (7.5 mg total) by mouth daily. 90 tablet  1  . phenazopyridine (PYRIDIUM) 200 MG tablet Take 1 tablet (200 mg total) by mouth 3 (three) times daily as needed for pain. 10 tablet 0   No facility-administered medications prior to visit.    Allergies  Allergen Reactions  . Amoxicillin     Severe itching    Review of Systems  Constitutional: Positive for fatigue. Negative for chills and fever.  HENT: Positive for sore throat. Negative for congestion and ear pain.   Respiratory: Negative for cough and shortness of breath.   Cardiovascular: Negative for chest pain.  Gastrointestinal: Positive for nausea. Negative for constipation, diarrhea and vomiting.  Neurological: Positive for headaches.       Objective:    Physical Exam Appears tired.  Remained 6 feet distance while interviewing.  Wore N95 and Eye shields.   There were no vitals taken for this visit. Wt Readings from Last 3  Encounters:  01/14/20 181 lb (82.1 kg)  01/04/20 181 lb 9.6 oz (82.4 kg)  10/27/19 172 lb (78 kg)    Health Maintenance Due  Topic Date Due  . Hepatitis C Screening  Never done  . HIV Screening  Never done  . TETANUS/TDAP  Never done  . PAP SMEAR-Modifier  Never done    There are no preventive care reminders to display for this patient.   Lab Results  Component Value Date   TSH 2.750 08/24/2019   Lab Results  Component Value Date   WBC 10.2 08/24/2019   HGB 13.2 08/24/2019   HCT 38.6 08/24/2019   MCV 85 08/24/2019   PLT 254 08/24/2019   Lab Results  Component Value Date   NA 141 08/24/2019   K 4.0 08/24/2019   CO2 20 08/24/2019   GLUCOSE 97 08/24/2019   BUN 13 08/24/2019   CREATININE 0.80 08/24/2019   BILITOT <0.2 08/24/2019   ALKPHOS 88 08/24/2019   AST 15 08/24/2019   ALT 9 08/24/2019   PROT 6.7 08/24/2019   ALBUMIN 4.1 08/24/2019   CALCIUM 9.5 08/24/2019   ANIONGAP 6 07/13/2014   Lab Results  Component Value Date   CHOL 200 (H) 08/24/2019   Lab Results  Component Value Date   HDL 35 (L) 08/24/2019   Lab Results  Component Value Date   LDLCALC 143 (H) 08/24/2019   Lab Results  Component Value Date   TRIG 122 08/24/2019   Lab Results  Component Value Date   CHOLHDL 5.7 (H) 08/24/2019   No results found for: HGBA1C     Assessment & Plan:  1. COVID-19 May return to work on Monday, if symptoms have resolved. Must wear mask diligently through day 10. - promethazine (PHENERGAN) 25 MG tablet; Take 1 tablet (25 mg total) by mouth every 8 (eight) hours as needed for nausea or vomiting.  Dispense: 20 tablet; Refill: 0 - POC COVID-19 positive - Influenza A/B  Negative.  2. Strep pharyngitis Rest, fluids, tylenol, ibuprofen. - azithromycin (ZITHROMAX) 250 MG tablet; Take two tablets by mouth on day one, take one tablet by mouth days two-five  Dispense: 6 tablet; Refill: 0 - Rapid Strep A positive.  3. Acute nonintractable headache, unspecified  headache type Tylenol - POC COVID-19 positive - Influenza A/B negative.  4. Fatigue, unspecified type - rest, rest, and more rest.   secondary to covid 19  Meds ordered this encounter  Medications  . promethazine (PHENERGAN) 25 MG tablet    Sig: Take 1 tablet (25 mg total) by mouth every 8 (eight) hours as  needed for nausea or vomiting.    Dispense:  20 tablet    Refill:  0    Order Specific Question:   Supervising Provider    AnswerBlane Ohara [id number]  . azithromycin (ZITHROMAX) 250 MG tablet    Sig: Take two tablets by mouth on day one, take one tablet by mouth days two-five    Dispense:  6 tablet    Refill:  0    Order Specific Question:   Supervising Provider    AnswerCorey Harold    Orders Placed This Encounter  Procedures  . Rapid Strep A  . POC COVID-19  . Influenza A/B    Follow-up: No follow-ups on file.  An After Visit Summary was printed and given to the patient.  Blane Ohara, MD [facility] [phone]

## 2020-03-27 ENCOUNTER — Ambulatory Visit (INDEPENDENT_AMBULATORY_CARE_PROVIDER_SITE_OTHER): Payer: Managed Care, Other (non HMO)

## 2020-03-27 DIAGNOSIS — R5383 Other fatigue: Secondary | ICD-10-CM

## 2020-03-27 LAB — POC COVID19 BINAXNOW: SARS Coronavirus 2 Ag: NEGATIVE

## 2020-03-29 LAB — NOVEL CORONAVIRUS, NAA

## 2020-08-04 ENCOUNTER — Ambulatory Visit (INDEPENDENT_AMBULATORY_CARE_PROVIDER_SITE_OTHER): Payer: Managed Care, Other (non HMO) | Admitting: Nurse Practitioner

## 2020-08-04 ENCOUNTER — Other Ambulatory Visit: Payer: Self-pay | Admitting: Nurse Practitioner

## 2020-08-04 ENCOUNTER — Encounter: Payer: Self-pay | Admitting: Nurse Practitioner

## 2020-08-04 VITALS — BP 112/78 | HR 64 | Temp 97.9°F | Ht 68.0 in | Wt 194.0 lb

## 2020-08-04 DIAGNOSIS — F419 Anxiety disorder, unspecified: Secondary | ICD-10-CM | POA: Diagnosis not present

## 2020-08-04 DIAGNOSIS — N3289 Other specified disorders of bladder: Secondary | ICD-10-CM

## 2020-08-04 DIAGNOSIS — R300 Dysuria: Secondary | ICD-10-CM

## 2020-08-04 DIAGNOSIS — N3001 Acute cystitis with hematuria: Secondary | ICD-10-CM

## 2020-08-04 LAB — POCT URINALYSIS DIPSTICK
Bilirubin, UA: NEGATIVE
Blood, UA: POSITIVE
Glucose, UA: NEGATIVE
Ketones, UA: NEGATIVE
Nitrite, UA: NEGATIVE
Protein, UA: POSITIVE — AB
Spec Grav, UA: 1.025 (ref 1.010–1.025)
Urobilinogen, UA: NEGATIVE E.U./dL — AB
pH, UA: 6 (ref 5.0–8.0)

## 2020-08-04 MED ORDER — CYCLOBENZAPRINE HCL 10 MG PO TABS: 10.0000 mg | ORAL_TABLET | Freq: Three times a day (TID) | ORAL | 0 refills | Status: DC | PRN

## 2020-08-04 MED ORDER — LORAZEPAM 1 MG PO TABS: 1.0000 mg | ORAL_TABLET | Freq: Two times a day (BID) | ORAL | 0 refills | Status: DC | PRN

## 2020-08-04 MED ORDER — PHENAZOPYRIDINE HCL 200 MG PO TABS: 200.0000 mg | ORAL_TABLET | Freq: Three times a day (TID) | ORAL | 0 refills | Status: DC | PRN

## 2020-08-04 MED ORDER — LORAZEPAM 0.5 MG PO TABS: 0.5000 mg | ORAL_TABLET | Freq: Every day | ORAL | 2 refills | Status: DC | PRN

## 2020-08-04 MED ORDER — CIPROFLOXACIN HCL 500 MG PO TABS: 500.0000 mg | ORAL_TABLET | Freq: Two times a day (BID) | ORAL | 0 refills | Status: AC

## 2020-08-04 MED ORDER — MELOXICAM 7.5 MG PO TABS: 7.5000 mg | ORAL_TABLET | Freq: Every day | ORAL | 1 refills | Status: DC

## 2020-08-04 NOTE — Progress Notes (Signed)
Acute Office Visit  Subjective:    Patient ID: Monica Banks, female    DOB: 24-Apr-1975, 45 y.o.   MRN: [id number]  Chief Complaint  Patient presents with  .     Urinary frequency and dysuria    HPI Patient is in today for dysuria and to discuss anxiety medication.  Dysuria Onset of dysuria was 3-days-ago. Treatment has included pushing fluids.She denies fever, flank pain, or hematuria.  Anxiety Anxiety, Follow-up Monica Banks tells me that her sister was murdered by her boyfriend on her 21st birthday several years ago. The man who killed her sister is in prison but has a parole hearing coming up in a few weeks. Monica Banks tells me that she promised her father on his death bed that she would talk to the parole board to ensure her sister's murderer is not released. She has experienced increased stress, anxiety, and insomnia related to this upcoming situation. She has requested an adjustment in her anxiety medication  She was last seen for anxiety 1year ago. Changes made at last visit include Lexapro 20 mg daily and Ativan 0.5 mg PRN .   She reports good compliance with treatment. She reports good tolerance of treatment. She is not having side effects.   She feels her anxiety is moderate and Unchanged since last visit.  Symptoms: No chest pain No difficulty concentrating  No dizziness Yes fatigue  No feelings of losing control Yes insomnia  Yes irritable No palpitations  No panic attacks No racing thoughts  No shortness of breath Yes sweating  No tremors/shakes    GAD-7 Results GAD-7 Generalized Anxiety Disorder Screening Tool 08/04/2020  1. Feeling Nervous, Anxious, or on Edge 1  2. Not Being Able to Stop or Control Worrying 0  3. Worrying Too Much About Different Things 2  4. Trouble Relaxing 3  5. Being So Restless it's Hard To Sit Still 2  6. Becoming Easily Annoyed or Irritable 3  7. Feeling Afraid As If Something Awful Might Happen 0  Total GAD-7 Score 11  Difficulty At  Work, Home, or Getting  Along With Others? Not difficult at all    PHQ-9 Scores PHQ9 SCORE ONLY 01/04/2020  PHQ-9 Total Score 0     Past Medical History:  Diagnosis Date  . Depression   . History of gestational diabetes   . History of kidney stones   . Other specified anxiety disorders   . Periodontal disease    on flagyl and clindamycin    Past Surgical History:  Procedure Laterality Date  . DILATION AND EVACUATION N/A 04/15/2019   Procedure: DILATATION AND EVACUATION;  Surgeon: Ranae Pila, MD;  Location: [facility];  Service: Gynecology;  Laterality: N/A;  . LITHOTRIPSY     x 1  . VOCAL CORD LATERALIZATION, ENDOSCOPIC APPROACH W/ MLB     vocal cord CYST removal    Family History  Problem Relation Age of Onset  . Hyperlipidemia Mother   . Diabetes Mother   . Diabetes Brother     Social History   Socioeconomic History  . Marital status: Divorced    Spouse name: Not on file  . Number of children: Not on file  . Years of education: Not on file  . Highest education level: Not on file  Occupational History  . Not on file  Tobacco Use  . Smoking status: Current Every Day Smoker    Packs/day: 1.00    Years: 29.00    Pack years: 29.00  Types: Cigarettes  . Smokeless tobacco: Never Used  Vaping Use  . Vaping Use: Never used  Substance and Sexual Activity  . Alcohol use: Never  . Drug use: Yes    Types: Marijuana    Comment: marijuana last used 04-12-2019 for nerves  . Sexual activity: Not on file  Other Topics Concern  . Not on file  Social History Narrative  . Not on file   Social Determinants of Health   Financial Resource Strain: Not on file  Food Insecurity: Not on file  Transportation Needs: Not on file  Physical Activity: Not on file  Stress: Not on file  Social Connections: Not on file  Intimate Partner Violence: Not on file    Outpatient Medications Prior to Visit  Medication Sig Dispense Refill  . acetaminophen  (TYLENOL) 500 MG tablet Take 1,000 mg by mouth every 6 (six) hours as needed.    . cyclobenzaprine (FLEXERIL) 10 MG tablet Take 1 tablet (10 mg total) by mouth 3 (three) times daily as needed for muscle spasms. 90 tablet 0  . escitalopram (LEXAPRO) 20 MG tablet Take 1 tablet (20 mg total) by mouth daily. 90 tablet 1  . flavoxATE (URISPAS) 100 MG tablet Take 1 tablet (100 mg total) by mouth 3 (three) times daily as needed for bladder spasms. 30 tablet 0  . ibuprofen (ADVIL) 200 MG tablet Take 200 mg by mouth every 6 (six) hours as needed. Took 4 of  200 mg tabs    . LORazepam (ATIVAN) 0.5 MG tablet Take 1 tablet (0.5 mg total) by mouth daily as needed for anxiety. 30 tablet 2  . meloxicam (MOBIC) 7.5 MG tablet Take 1 tablet (7.5 mg total) by mouth daily. 90 tablet 1  . promethazine (PHENERGAN) 25 MG tablet Take 1 tablet (25 mg total) by mouth every 8 (eight) hours as needed for nausea or vomiting. 20 tablet 0  . azithromycin (ZITHROMAX) 250 MG tablet Take two tablets by mouth on day one, take one tablet by mouth days two-five 6 tablet 0  . phenazopyridine (PYRIDIUM) 200 MG tablet Take 1 tablet (200 mg total) by mouth 3 (three) times daily as needed for pain. 10 tablet 0   No facility-administered medications prior to visit.    Allergies  Allergen Reactions  . Amoxicillin     Severe itching    Review of Systems  Constitutional: Negative for fatigue and fever.  HENT: Negative for congestion, ear pain, sinus pressure and sore throat.   Eyes: Negative for pain.  Respiratory: Negative for cough, chest tightness, shortness of breath and wheezing.   Cardiovascular: Negative for chest pain and palpitations.  Gastrointestinal: Negative for abdominal pain, constipation, diarrhea, nausea and vomiting.  Genitourinary: Positive for decreased urine volume, dysuria, frequency and urgency. Negative for hematuria.  Musculoskeletal: Negative for arthralgias, back pain, joint swelling and myalgias.  Skin:  Negative for rash.  Neurological: Negative for dizziness, weakness and headaches.  Psychiatric/Behavioral: Positive for decreased concentration and sleep disturbance. Negative for dysphoric mood. The patient is nervous/anxious.        Objective:    Physical Exam Vitals reviewed.  Constitutional:      Appearance: Normal appearance.  HENT:     Head: Normocephalic.     Nose: Nose normal.     Mouth/Throat:     Mouth: Mucous membranes are moist.  Cardiovascular:     Rate and Rhythm: Normal rate and regular rhythm.     Pulses: Normal pulses.     Heart  sounds: Normal heart sounds.  Pulmonary:     Effort: Pulmonary effort is normal.     Breath sounds: Normal breath sounds.  Abdominal:     General: Bowel sounds are normal.     Palpations: Abdomen is soft.     Tenderness: There is no abdominal tenderness. There is no right CVA tenderness or left CVA tenderness.  Skin:    General: Skin is warm and dry.     Capillary Refill: Capillary refill takes less than 2 seconds.  Neurological:     General: No focal deficit present.     Mental Status: She is alert and oriented to person, place, and time.  Psychiatric:        Mood and Affect: Mood normal.        Behavior: Behavior normal.     BP 112/78 (BP Location: Left Arm, Patient Position: Sitting)   Pulse 64   Temp 97.9 F (36.6 C) (Temporal)   Ht 5\' 8"  (1.727 m)   Wt 194 lb (88 kg)   SpO2 99%   BMI 29.50 kg/m  Wt Readings from Last 3 Encounters:  01/14/20 181 lb (82.1 kg)  01/04/20 181 lb 9.6 oz (82.4 kg)  10/27/19 172 lb (78 kg)    Health Maintenance Due  Topic Date Due  . Pneumococcal Vaccine 34-59 Years old (1 of 2 - PPSV23) Never done  . HIV Screening  Never done  . Hepatitis C Screening  Never done  . TETANUS/TDAP  Never done  . PAP SMEAR-Modifier  Never done  . COVID-19 Vaccine (3 - Booster for Pfizer series) 05/06/2020       Lab Results  Component Value Date   TSH 2.750 08/24/2019   Lab Results  Component  Value Date   WBC 10.2 08/24/2019   HGB 13.2 08/24/2019   HCT 38.6 08/24/2019   MCV 85 08/24/2019   PLT 254 08/24/2019   Lab Results  Component Value Date   NA 141 08/24/2019   K 4.0 08/24/2019   CO2 20 08/24/2019   GLUCOSE 97 08/24/2019   BUN 13 08/24/2019   CREATININE 0.80 08/24/2019   BILITOT <0.2 08/24/2019   ALKPHOS 88 08/24/2019   AST 15 08/24/2019   ALT 9 08/24/2019   PROT 6.7 08/24/2019   ALBUMIN 4.1 08/24/2019   CALCIUM 9.5 08/24/2019   ANIONGAP 6 07/13/2014   Lab Results  Component Value Date   CHOL 200 (H) 08/24/2019   Lab Results  Component Value Date   HDL 35 (L) 08/24/2019   Lab Results  Component Value Date   LDLCALC 143 (H) 08/24/2019   Lab Results  Component Value Date   TRIG 122 08/24/2019   Lab Results  Component Value Date   CHOLHDL 5.7 (H) 08/24/2019         Assessment & Plan:   1. Acute cystitis with hematuria - Urine Culture - ciprofloxacin (CIPRO) 500 MG tablet; Take 1 tablet (500 mg total) by mouth 2 (two) times daily for 10 days.  Dispense: 20 tablet; Refill: 0  2. Dysuria - POCT urinalysis dipstick  3. Bladder spasm - phenazopyridine (PYRIDIUM) 200 MG tablet; Take 1 tablet (200 mg total) by mouth 3 (three) times daily as needed for pain.  Dispense: 10 tablet; Refill: 0  4. Anxiety - LORazepam (ATIVAN) 1 MG tablet; Take 1 tablet (1 mg total) by mouth 2 (two) times daily as needed for anxiety.  Dispense: 60 tablet; Refill: 0    I,Lauren M Auman,acting as a  scribe for Janie Morning, NP.,have documented all relevant documentation on the behalf of Janie Morning, NP,as directed by  Janie Morning, NP while in the presence of Janie Morning, NP.  I, Janie Morning, NP, have reviewed all documentation for this visit. The documentation on 08/06/20 for the exam, diagnosis, procedures, and orders are all accurate and complete.    Follow-up: As needed   Signed, Flonnie Hailstone, DNP

## 2020-08-06 LAB — URINE CULTURE

## 2020-09-07 ENCOUNTER — Other Ambulatory Visit: Payer: Self-pay | Admitting: Family Medicine

## 2021-01-02 ENCOUNTER — Other Ambulatory Visit: Payer: Self-pay | Admitting: Family Medicine

## 2021-01-02 DIAGNOSIS — Z1231 Encounter for screening mammogram for malignant neoplasm of breast: Secondary | ICD-10-CM

## 2021-01-29 ENCOUNTER — Other Ambulatory Visit: Payer: Self-pay

## 2021-01-29 ENCOUNTER — Ambulatory Visit
Admission: RE | Admit: 2021-01-29 | Discharge: 2021-01-29 | Disposition: A | Discharge status: Home / Self Care | Payer: Managed Care, Other (non HMO) | Source: Ambulatory Visit | Attending: Family Medicine | Admitting: Family Medicine

## 2021-01-29 DIAGNOSIS — Z1231 Encounter for screening mammogram for malignant neoplasm of breast: Secondary | ICD-10-CM

## 2021-03-28 ENCOUNTER — Other Ambulatory Visit: Payer: Self-pay | Admitting: Physician Assistant

## 2021-03-28 ENCOUNTER — Ambulatory Visit (INDEPENDENT_AMBULATORY_CARE_PROVIDER_SITE_OTHER): Payer: Managed Care, Other (non HMO) | Admitting: Physician Assistant

## 2021-03-28 ENCOUNTER — Encounter: Payer: Self-pay | Admitting: Physician Assistant

## 2021-03-28 VITALS — BP 138/68 | HR 88 | Temp 97.7°F | Resp 18 | Ht 68.0 in | Wt 199.8 lb

## 2021-03-28 DIAGNOSIS — F419 Anxiety disorder, unspecified: Secondary | ICD-10-CM

## 2021-03-28 DIAGNOSIS — L03314 Cellulitis of groin: Secondary | ICD-10-CM | POA: Diagnosis not present

## 2021-03-28 MED ORDER — LORAZEPAM 1 MG PO TABS: 1.0000 mg | ORAL_TABLET | Freq: Two times a day (BID) | ORAL | 0 refills | Status: DC | PRN

## 2021-03-28 MED ORDER — DOXYCYCLINE HYCLATE 100 MG PO TABS: 100.0000 mg | ORAL_TABLET | Freq: Two times a day (BID) | ORAL | 0 refills | Status: DC

## 2021-03-28 MED ORDER — CYCLOBENZAPRINE HCL 10 MG PO TABS: 10.0000 mg | ORAL_TABLET | Freq: Three times a day (TID) | ORAL | 0 refills | Status: DC | PRN

## 2021-03-28 MED ORDER — MELOXICAM 7.5 MG PO TABS: 7.5000 mg | ORAL_TABLET | Freq: Every day | ORAL | 0 refills | Status: DC

## 2021-03-28 NOTE — Progress Notes (Signed)
Acute Office Visit  Subjective:    Patient ID: Monica Banks, female    DOB: 1975-11-11, 46 y.o.   MRN: [id number]  Chief Complaint  Patient presents with   Boil    HPI: Patient is in today for complaints of abscess in right crease of groin - states that it started about a month ago as a small red raised area and in the past week more inflamed with erythema Denies fever or malaise  Past Medical History:  Diagnosis Date   Depression    History of gestational diabetes    History of kidney stones    Other specified anxiety disorders    Periodontal disease    on flagyl and clindamycin    Past Surgical History:  Procedure Laterality Date   DILATION AND EVACUATION N/A 04/15/2019   Procedure: DILATATION AND EVACUATION;  Surgeon: Ranae Pila, MD;  Location: [facility];  Service: Gynecology;  Laterality: N/A;   LITHOTRIPSY     x 1   VOCAL CORD LATERALIZATION, ENDOSCOPIC APPROACH W/ MLB     vocal cord CYST removal    Family History  Problem Relation Age of Onset   Hyperlipidemia Mother    Diabetes Mother    Diabetes Brother    Breast cancer Neg Hx     Social History   Socioeconomic History   Marital status: Divorced    Spouse name: Not on file   Number of children: Not on file   Years of education: Not on file   Highest education level: Not on file  Occupational History   Not on file  Tobacco Use   Smoking status: Every Day    Packs/day: 1.00    Years: 29.00    Pack years: 29.00    Types: Cigarettes   Smokeless tobacco: Never  Vaping Use   Vaping Use: Never used  Substance and Sexual Activity   Alcohol use: Never   Drug use: Yes    Types: Marijuana    Comment: marijuana last used 04-12-2019 for nerves   Sexual activity: Not on file  Other Topics Concern   Not on file  Social History Narrative   Not on file   Social Determinants of Health   Financial Resource Strain: Not on file  Food Insecurity: Not on file  Transportation  Needs: Not on file  Physical Activity: Not on file  Stress: Not on file  Social Connections: Not on file  Intimate Partner Violence: Not on file    Outpatient Medications Prior to Visit  Medication Sig Dispense Refill   acetaminophen (TYLENOL) 500 MG tablet Take 1,000 mg by mouth every 6 (six) hours as needed.     escitalopram (LEXAPRO) 20 MG tablet TAKE 1 TABLET(20 MG) BY MOUTH DAILY 90 tablet 1   flavoxATE (URISPAS) 100 MG tablet Take 1 tablet (100 mg total) by mouth 3 (three) times daily as needed for bladder spasms. 30 tablet 0   ibuprofen (ADVIL) 200 MG tablet Take 200 mg by mouth every 6 (six) hours as needed. Took 4 of  200 mg tabs     phenazopyridine (PYRIDIUM) 200 MG tablet Take 1 tablet (200 mg total) by mouth 3 (three) times daily as needed for pain. 10 tablet 0   promethazine (PHENERGAN) 25 MG tablet Take 1 tablet (25 mg total) by mouth every 8 (eight) hours as needed for nausea or vomiting. 20 tablet 0   cyclobenzaprine (FLEXERIL) 10 MG tablet Take 1 tablet (10 mg total) by  mouth 3 (three) times daily as needed for muscle spasms. 90 tablet 0   LORazepam (ATIVAN) 1 MG tablet Take 1 tablet (1 mg total) by mouth 2 (two) times daily as needed for anxiety. 60 tablet 0   meloxicam (MOBIC) 7.5 MG tablet Take 1 tablet (7.5 mg total) by mouth daily. 90 tablet 1   No facility-administered medications prior to visit.    Allergies  Allergen Reactions   Amoxicillin     Severe itching    Review of Systems CONSTITUTIONAL: Negative for chills, fatigue, fever, unintentional weight gain and unintentional weight loss.  CARDIOVASCULAR: Negative for chest pain, dizziness, palpitations and pedal edema.  RESPIRATORY: Negative for recent cough and dyspnea.  INTEGUMENTARY: see HPI      Objective:    Physical Exam PHYSICAL EXAM:   VS: BP 138/68    Pulse 88    Temp 97.7 F (36.5 C)    Resp 18    Ht 5\' 8"  (1.727 m)    Wt 199 lb 12.8 oz (90.6 kg)    SpO2 95%    BMI 30.38 kg/m   GEN: Well  nourished, well developed, in no acute distress   Cardiac: RRR; no murmurs,  Respiratory:  normal respiratory rate and pattern with no distress - normal breath sounds with no rales, rhonchi, wheezes or rubs  Skin: erythema noted right groin crease with small abscess -- no fluctuance   BP 138/68    Pulse 88    Temp 97.7 F (36.5 C)    Resp 18    Ht 5\' 8"  (1.727 m)    Wt 199 lb 12.8 oz (90.6 kg)    SpO2 95%    BMI 30.38 kg/m  Wt Readings from Last 3 Encounters:  03/28/21 199 lb 12.8 oz (90.6 kg)  08/04/20 194 lb (88 kg)  01/14/20 181 lb (82.1 kg)    Health Maintenance Due  Topic Date Due   HIV Screening  Never done   Hepatitis C Screening  Never done   TETANUS/TDAP  Never done   PAP SMEAR-Modifier  Never done   COVID-19 Vaccine (3 - Booster for Pfizer series) 02/01/2020   INFLUENZA VACCINE  10/02/2020   COLONOSCOPY (Pts [id number] Insurance coverage will need to be confirmed)  Never done    There are no preventive care reminders to display for this patient.   Lab Results  Component Value Date   TSH 2.750 08/24/2019   Lab Results  Component Value Date   WBC 10.2 08/24/2019   HGB 13.2 08/24/2019   HCT 38.6 08/24/2019   MCV 85 08/24/2019   PLT 254 08/24/2019   Lab Results  Component Value Date   NA 141 08/24/2019   K 4.0 08/24/2019   CO2 20 08/24/2019   GLUCOSE 97 08/24/2019   BUN 13 08/24/2019   CREATININE 0.80 08/24/2019   BILITOT <0.2 08/24/2019   ALKPHOS 88 08/24/2019   AST 15 08/24/2019   ALT 9 08/24/2019   PROT 6.7 08/24/2019   ALBUMIN 4.1 08/24/2019   CALCIUM 9.5 08/24/2019   ANIONGAP 6 07/13/2014   Lab Results  Component Value Date   CHOL 200 (H) 08/24/2019   Lab Results  Component Value Date   HDL 35 (L) 08/24/2019   Lab Results  Component Value Date   LDLCALC 143 (H) 08/24/2019   Lab Results  Component Value Date   TRIG 122 08/24/2019   Lab Results  Component Value Date   CHOLHDL 5.7 (H) 08/24/2019   No  results found for: HGBA1C      Assessment & Plan:   Problem List Items Addressed This Visit   None Visit Diagnoses     Cellulitis of right groin    -  Primary   Relevant Medications   doxycycline (VIBRA-TABS) 100 MG tablet      Meds ordered this encounter  Medications   doxycycline (VIBRA-TABS) 100 MG tablet    Sig: Take 1 tablet (100 mg total) by mouth 2 (two) times daily.    Dispense:  20 tablet    Refill:  0    Order Specific Question:   Supervising Provider    Answer:   Corey Harold    No orders of the defined types were placed in this encounter.    Follow-up: Return if symptoms worsen or fail to improve. Recommend warm  compresses and follow up if worsens for I & D An After Visit Summary was printed and given to the patient.  Jettie Pagan [facility] [phone]

## 2021-04-10 ENCOUNTER — Ambulatory Visit (INDEPENDENT_AMBULATORY_CARE_PROVIDER_SITE_OTHER): Payer: Managed Care, Other (non HMO) | Admitting: Nurse Practitioner

## 2021-04-10 ENCOUNTER — Encounter: Payer: Self-pay | Admitting: Nurse Practitioner

## 2021-04-10 ENCOUNTER — Other Ambulatory Visit: Payer: Self-pay

## 2021-04-10 VITALS — BP 138/82 | HR 68 | Temp 97.1°F | Resp 20 | Ht 68.0 in | Wt 200.0 lb

## 2021-04-10 DIAGNOSIS — K529 Noninfective gastroenteritis and colitis, unspecified: Secondary | ICD-10-CM

## 2021-04-10 MED ORDER — PROMETHAZINE HCL 25 MG/ML IJ SOLN: 25.0000 mg | Freq: Once | INTRAMUSCULAR | Status: DC

## 2021-04-10 MED ORDER — PROMETHAZINE HCL 25 MG PO TABS: 25.0000 mg | ORAL_TABLET | Freq: Three times a day (TID) | ORAL | 0 refills | Status: DC | PRN

## 2021-04-10 MED ORDER — ONDANSETRON HCL 4 MG PO TABS: 4.0000 mg | ORAL_TABLET | Freq: Three times a day (TID) | ORAL | 0 refills | Status: DC | PRN

## 2021-04-10 MED ORDER — PROMETHAZINE HCL 25 MG/ML IJ SOLN
25.0000 mg | Freq: Once | INTRAMUSCULAR | Status: AC
  Administered 2021-04-10: 25 mg via INTRAMUSCULAR

## 2021-04-10 MED ORDER — MECLIZINE HCL 25 MG PO TABS: 25.0000 mg | ORAL_TABLET | Freq: Three times a day (TID) | ORAL | 0 refills | Status: DC | PRN

## 2021-04-10 MED ORDER — PROMETHAZINE HCL 25 MG PO TABS: 25.0000 mg | ORAL_TABLET | Freq: Once | ORAL | Status: DC

## 2021-04-10 NOTE — Patient Instructions (Signed)
Prescriptions for phenergan and zofran sent to pharmacy for nausea Prescription for meclizine sent to pharmacy for dizziness Push fluids Advance diet as tolerated Obtain stool studies in 24-48 hours if diarrhea persists    Food Choices to Help Relieve Diarrhea, Adult Diarrhea can make you feel weak and cause you to become dehydrated. It is important to choose the right foods and drinks to: Relieve diarrhea. Replace lost fluids and nutrients. Prevent dehydration. What are tips for following this plan? Relieving diarrhea Avoid foods that make your diarrhea worse. These may include: Foods and beverages sweetened with high-fructose corn syrup, honey, or sweeteners such as xylitol, sorbitol, and mannitol. Fried, greasy, or spicy foods. Raw fruits and vegetables. Eat foods that are rich in probiotics. These include foods such as yogurt and fermented milk products. Probiotics can help increase healthy bacteria in your stomach and intestines (gastrointestinal tract or GI tract). This may help digestion and stop diarrhea. If you have lactose intolerance, avoid dairy products. These may make your diarrhea worse. Take medicine to help stop diarrhea only as told by your health care provider. Replacing nutrients  Eat bland, easy-to-digest foods in small amounts as you are able, until your diarrhea starts to get better. These foods include bananas, applesauce, rice, toast, and crackers. Gradually reintroduce nutrient-rich foods as tolerated or as told by your health care provider. This includes: Well-cooked protein foods, such as eggs, lean meats like fish or chicken without skin, and tofu. Peeled, seeded, and soft-cooked fruits and vegetables. Low-fat dairy products. Whole grains. Take vitamin and mineral supplements as told by your health care provider. Preventing dehydration  Start by sipping water or a solution to prevent dehydration (oral rehydration solution, ORS). This is a drink that helps  replace fluids and minerals your body has lost. You can buy an ORS at pharmacies and retail stores. Try to drink at least 8-10 cups (2,000-2,500 mL) of fluid each day to help replace lost fluids. If you have urine that is pale yellow, you are getting enough fluids. You may drink other liquids in addition to water, such as fruit juice that you have added water to (diluted fruit juice) or low-calorie sports drinks, as tolerated or as told by your health care provider. Avoid drinks with caffeine, such as coffee, tea, or soft drinks. Avoid alcohol. Summary When you have diarrhea, it is important to choose the right foods and drinks to relieve diarrhea, to replace lost fluids and nutrients, and to prevent dehydration. Make sure you drink enough fluid to keep your urine pale yellow. You may benefit from eating bland foods at first. Gradually reintroduce healthy, nutrient-rich foods as tolerated or as told by your health care provider. Avoid foods that make your diarrhea worse, such as fried, greasy, or spicy foods. This information is not intended to replace advice given to you by your health care provider. Make sure you discuss any questions you have with your health care provider. Document Revised: 04/06/2019 Document Reviewed: 04/06/2019 Elsevier Patient Education  2022 [employer].

## 2021-04-10 NOTE — Progress Notes (Signed)
Acute Office Visit  Subjective:    Patient ID: Monica Banks, female    DOB: 12-Jan-1976, 46 y.o.   MRN: [id number]  No chief complaint on file.   HPI Patient is in today for abdominal pain, diarrhea, nausea, and dizziness. Onset of symptoms was this morning. She denies treatment or known exposure to ill-contacts. States she ate fast food from [employer] last night. No other family members have similar symptoms.   Past Medical History:  Diagnosis Date   Depression    History of gestational diabetes    History of kidney stones    Other specified anxiety disorders    Periodontal disease    on flagyl and clindamycin    Past Surgical History:  Procedure Laterality Date   DILATION AND EVACUATION N/A 04/15/2019   Procedure: DILATATION AND EVACUATION;  Surgeon: Ranae Pila, MD;  Location: [facility];  Service: Gynecology;  Laterality: N/A;   LITHOTRIPSY     x 1   VOCAL CORD LATERALIZATION, ENDOSCOPIC APPROACH W/ MLB     vocal cord CYST removal    Family History  Problem Relation Age of Onset   Hyperlipidemia Mother    Diabetes Mother    Diabetes Brother    Breast cancer Neg Hx     Social History   Socioeconomic History   Marital status: Divorced    Spouse name: Not on file   Number of children: Not on file   Years of education: Not on file   Highest education level: Not on file  Occupational History   Not on file  Tobacco Use   Smoking status: Every Day    Packs/day: 1.00    Years: 29.00    Pack years: 29.00    Types: Cigarettes   Smokeless tobacco: Never  Vaping Use   Vaping Use: Never used  Substance and Sexual Activity   Alcohol use: Never   Drug use: Yes    Types: Marijuana    Comment: marijuana last used 04-12-2019 for nerves   Sexual activity: Not on file  Other Topics Concern   Not on file  Social History Narrative   Not on file   Social Determinants of Health   Financial Resource Strain: Not on file  Food Insecurity:  Not on file  Transportation Needs: Not on file  Physical Activity: Not on file  Stress: Not on file  Social Connections: Not on file  Intimate Partner Violence: Not on file    Outpatient Medications Prior to Visit  Medication Sig Dispense Refill   acetaminophen (TYLENOL) 500 MG tablet Take 1,000 mg by mouth every 6 (six) hours as needed.     cyclobenzaprine (FLEXERIL) 10 MG tablet Take 1 tablet (10 mg total) by mouth 3 (three) times daily as needed for muscle spasms. 90 tablet 0   doxycycline (VIBRA-TABS) 100 MG tablet Take 1 tablet (100 mg total) by mouth 2 (two) times daily. 20 tablet 0   escitalopram (LEXAPRO) 20 MG tablet TAKE 1 TABLET(20 MG) BY MOUTH DAILY 90 tablet 1   flavoxATE (URISPAS) 100 MG tablet Take 1 tablet (100 mg total) by mouth 3 (three) times daily as needed for bladder spasms. 30 tablet 0   ibuprofen (ADVIL) 200 MG tablet Take 200 mg by mouth every 6 (six) hours as needed. Took 4 of  200 mg tabs     LORazepam (ATIVAN) 1 MG tablet Take 1 tablet (1 mg total) by mouth 2 (two) times daily as needed for  anxiety. 60 tablet 0   meloxicam (MOBIC) 7.5 MG tablet Take 1 tablet (7.5 mg total) by mouth daily. 90 tablet 0   phenazopyridine (PYRIDIUM) 200 MG tablet Take 1 tablet (200 mg total) by mouth 3 (three) times daily as needed for pain. 10 tablet 0   promethazine (PHENERGAN) 25 MG tablet Take 1 tablet (25 mg total) by mouth every 8 (eight) hours as needed for nausea or vomiting. 20 tablet 0   No facility-administered medications prior to visit.    Allergies  Allergen Reactions   Amoxicillin     Severe itching    Review of Systems  Constitutional:  Positive for diaphoresis.  Gastrointestinal:  Positive for abdominal pain, diarrhea and nausea.  Neurological:  Positive for dizziness and weakness.      Objective:    Physical Exam Vitals reviewed.  Constitutional:      Appearance: She is ill-appearing and diaphoretic.  Cardiovascular:     Rate and Rhythm: Normal rate  and regular rhythm.  Pulmonary:     Effort: Pulmonary effort is normal.     Breath sounds: Normal breath sounds.  Abdominal:     General: Bowel sounds are normal.     Palpations: Abdomen is soft.     Tenderness: There is abdominal tenderness. There is guarding. There is no rebound.  Skin:    General: Skin is warm.     Capillary Refill: Capillary refill takes less than 2 seconds.  Neurological:     General: No focal deficit present.  Psychiatric:        Mood and Affect: Mood normal.        Behavior: Behavior normal.    BP 138/82    Pulse 68    Temp (!) 97.1 F (36.2 C)    Resp 20    Ht 5\' 8"  (1.727 m)    Wt 200 lb (90.7 kg)    SpO2 96%    BMI 30.41 kg/m   Wt Readings from Last 3 Encounters:  03/28/21 199 lb 12.8 oz (90.6 kg)  08/04/20 194 lb (88 kg)  01/14/20 181 lb (82.1 kg)    Health Maintenance Due  Topic Date Due   HIV Screening  Never done   Hepatitis C Screening  Never done   TETANUS/TDAP  Never done   PAP SMEAR-Modifier  Never done   COVID-19 Vaccine (3 - Booster for Pfizer series) 02/01/2020   INFLUENZA VACCINE  10/02/2020   COLONOSCOPY (Pts 45-28yrs Insurance coverage will need to be confirmed)  Never done       Lab Results  Component Value Date   TSH 2.750 08/24/2019   Lab Results  Component Value Date   WBC 10.2 08/24/2019   HGB 13.2 08/24/2019   HCT 38.6 08/24/2019   MCV 85 08/24/2019   PLT 254 08/24/2019   Lab Results  Component Value Date   NA 141 08/24/2019   K 4.0 08/24/2019   CO2 20 08/24/2019   GLUCOSE 97 08/24/2019   BUN 13 08/24/2019   CREATININE 0.80 08/24/2019   BILITOT <0.2 08/24/2019   ALKPHOS 88 08/24/2019   AST 15 08/24/2019   ALT 9 08/24/2019   PROT 6.7 08/24/2019   ALBUMIN 4.1 08/24/2019   CALCIUM 9.5 08/24/2019   ANIONGAP 6 07/13/2014   Lab Results  Component Value Date   CHOL 200 (H) 08/24/2019   Lab Results  Component Value Date   HDL 35 (L) 08/24/2019   Lab Results  Component Value Date   LDLCALC  143 (H)  08/24/2019   Lab Results  Component Value Date   TRIG 122 08/24/2019   Lab Results  Component Value Date   CHOLHDL 5.7 (H) 08/24/2019        Assessment & Plan:   1. Gastroenteritis - ondansetron (ZOFRAN) 4 MG tablet; Take 1 tablet (4 mg total) by mouth every 8 (eight) hours as needed for nausea or vomiting.  Dispense: 20 tablet; Refill: 0 - meclizine (ANTIVERT) 25 MG tablet; Take 1 tablet (25 mg total) by mouth 3 (three) times daily as needed for dizziness.  Dispense: 30 tablet; Refill: 0 - promethazine (PHENERGAN) 25 MG tablet; Take 1 tablet (25 mg total) by mouth every 8 (eight) hours as needed for nausea or vomiting.  Dispense: 20 tablet; Refill: 0 - Cdiff NAA+O+P+Stool Culture - promethazine (PHENERGAN) injection 25 mg      Prescriptions for phenergan and zofran sent to pharmacy for nausea Prescription for meclizine sent to pharmacy for dizziness Push fluids Advance diet as tolerated Obtain stool studies in 24-48 hours if diarrhea persists   Follow-up: As needed  I, Janie Morning, NP, have reviewed all documentation for this visit. The documentation on 04/10/21 for the exam, diagnosis, procedures, and orders are all accurate and complete.    Signed, Janie Morning, NP 04/10/21 at 9:35 AM

## 2021-05-06 ENCOUNTER — Other Ambulatory Visit: Payer: Self-pay | Admitting: Family Medicine

## 2021-05-07 NOTE — Telephone Encounter (Signed)
Refill sent to pharmacy.   

## 2021-06-23 ENCOUNTER — Other Ambulatory Visit: Payer: Self-pay | Admitting: Physician Assistant

## 2021-08-30 ENCOUNTER — Ambulatory Visit (INDEPENDENT_AMBULATORY_CARE_PROVIDER_SITE_OTHER): Payer: Managed Care, Other (non HMO) | Admitting: Nurse Practitioner

## 2021-08-30 ENCOUNTER — Encounter: Payer: Self-pay | Admitting: Nurse Practitioner

## 2021-08-30 VITALS — BP 110/70 | HR 85 | Temp 97.1°F | Ht 68.0 in | Wt 180.0 lb

## 2021-08-30 DIAGNOSIS — Z8052 Family history of malignant neoplasm of bladder: Secondary | ICD-10-CM | POA: Diagnosis not present

## 2021-08-30 DIAGNOSIS — Z87442 Personal history of urinary calculi: Secondary | ICD-10-CM | POA: Diagnosis not present

## 2021-08-30 DIAGNOSIS — R310 Gross hematuria: Secondary | ICD-10-CM

## 2021-08-30 DIAGNOSIS — F17210 Nicotine dependence, cigarettes, uncomplicated: Secondary | ICD-10-CM

## 2021-08-30 LAB — POCT URINALYSIS DIP (CLINITEK)
Bilirubin, UA: NEGATIVE
Glucose, UA: NEGATIVE mg/dL
Ketones, POC UA: NEGATIVE mg/dL
Nitrite, UA: NEGATIVE
POC PROTEIN,UA: 100 — AB
Spec Grav, UA: >=1.03 — AB (ref 1.010–1.025)
Urobilinogen, UA: 1 E.U./dL
pH, UA: 6 (ref 5.0–8.0)

## 2021-08-30 MED ORDER — NITROFURANTOIN MONOHYD MACRO 100 MG PO CAPS: 100.0000 mg | ORAL_CAPSULE | Freq: Two times a day (BID) | ORAL | 0 refills | Status: DC

## 2021-08-30 NOTE — Patient Instructions (Addendum)
Push fluids, especially water We will call you with labs and urology referral Take Macrobid twice daily for 7 days Seek emergency medical care for any severe or concerning symptoms Follow-up as needed    Hematuria, Adult Hematuria is blood in the urine. Blood may be visible in the urine, or it may be identified with a test. This condition can be caused by infections of the bladder, urethra, kidney, or prostate. Other possible causes include: Kidney stones. Cancer of the urinary tract. Too much calcium in the urine. Conditions that are passed from parent to child (inherited conditions). Exercise that requires a lot of energy. Infections can usually be treated with medicine, and a kidney stone usually will pass through your urine. If neither of these is the cause of your hematuria, more tests may be needed to identify the cause of your symptoms. It is very important to tell your health care provider about any blood in your urine, even if it is painless or the blood stops without treatment. Blood in the urine, when it happens and then stops and then happens again, can be a symptom of a very serious condition, including cancer. There is no pain in the initial stages of many urinary cancers. Follow these instructions at home: Medicines Take over-the-counter and prescription medicines only as told by your health care provider. If you were prescribed an antibiotic medicine, take it as told by your health care provider. Do not stop taking the antibiotic even if you start to feel better. Eating and drinking Drink enough fluid to keep your urine pale yellow. It is recommended that you drink 3-4 quarts (2.8-3.8 L) a day. If you have been diagnosed with an infection, drinking cranberry juice in addition to large amounts of water is recommended. Avoid caffeine, tea, and carbonated beverages. These tend to irritate the bladder. Avoid alcohol because it may irritate the prostate (in males). General  instructions If you have been diagnosed with a kidney stone, follow your health care provider's instructions about straining your urine to catch the stone. Empty your bladder often. Avoid holding urine for long periods of time. If you are female: After a bowel movement, wipe from front to back and use each piece of toilet paper only once. Empty your bladder before and after sex. Pay attention to any changes in your symptoms. Tell your health care provider about any changes or any new symptoms. It is up to you to get the results of any tests. Ask your health care provider, or the department that is doing the test, when your results will be ready. Keep all follow-up visits. This is important. Contact a health care provider if: You develop back pain. You have a fever or chills. You have nausea or vomiting. Your symptoms do not improve after 3 days. Your symptoms get worse. Get help right away if: You develop severe vomiting and are unable to take medicine without vomiting. You develop severe pain in your back or abdomen even though you are taking medicine. You pass a large amount of blood in your urine. You pass blood clots in your urine. You feel very weak or like you might faint. You faint. Summary Hematuria is blood in the urine. It has many possible causes. It is very important that you tell your health care provider about any blood in your urine, even if it is painless or the blood stops without treatment. Take over-the-counter and prescription medicines only as told by your health care provider. Drink enough fluid to keep  your urine pale yellow. This information is not intended to replace advice given to you by your health care provider. Make sure you discuss any questions you have with your health care provider. Document Revised: 10/20/2019 Document Reviewed: 10/20/2019 Elsevier Patient Education  2023 [employer].

## 2021-08-30 NOTE — Progress Notes (Signed)
Acute Office Visit  Subjective:    Patient ID: Monica Banks, female    DOB: March 21, 1975, 46 y.o.   MRN: [id number]  Chief Complaint  Patient presents with   Hematuria    HPI: Patient is in today for Urinary symptoms  She reports new onset gross hematuria . The current episode started this morning and is staying constant. Denies dysuria, urgency, frequency, or flank pain. Past history of kidney stone, family history of bladder cancer, and chronic cigarette smoker.      Past Medical History:  Diagnosis Date   Depression    History of gestational diabetes    History of kidney stones    Other specified anxiety disorders    Periodontal disease    on flagyl and clindamycin    Past Surgical History:  Procedure Laterality Date   DILATION AND EVACUATION N/A 04/15/2019   Procedure: DILATATION AND EVACUATION;  Surgeon: Ranae Pila, MD;  Location: [facility];  Service: Gynecology;  Laterality: N/A;   LITHOTRIPSY     x 1   VOCAL CORD LATERALIZATION, ENDOSCOPIC APPROACH W/ MLB     vocal cord CYST removal    Family History  Problem Relation Age of Onset   Hyperlipidemia Mother    Diabetes Mother    Diabetes Brother    Breast cancer Neg Hx     Social History   Socioeconomic History   Marital status: Divorced    Spouse name: Not on file   Number of children: Not on file   Years of education: Not on file   Highest education level: Not on file  Occupational History   Not on file  Tobacco Use   Smoking status: Every Day    Packs/day: 1.00    Years: 29.00    Total pack years: 29.00    Types: Cigarettes   Smokeless tobacco: Never  Vaping Use   Vaping Use: Never used  Substance and Sexual Activity   Alcohol use: Never   Drug use: Yes    Types: Marijuana    Comment: marijuana last used 04-12-2019 for nerves   Sexual activity: Not on file  Other Topics Concern   Not on file  Social History Narrative   Not on file   Social Determinants of  Health   Financial Resource Strain: Not on file  Food Insecurity: Not on file  Transportation Needs: Not on file  Physical Activity: Not on file  Stress: Not on file  Social Connections: Not on file  Intimate Partner Violence: Not on file    Outpatient Medications Prior to Visit  Medication Sig Dispense Refill   acetaminophen (TYLENOL) 500 MG tablet Take 1,000 mg by mouth every 6 (six) hours as needed.     cyclobenzaprine (FLEXERIL) 10 MG tablet Take 1 tablet (10 mg total) by mouth 3 (three) times daily as needed for muscle spasms. 90 tablet 0   doxycycline (VIBRA-TABS) 100 MG tablet Take 1 tablet (100 mg total) by mouth 2 (two) times daily. 20 tablet 0   escitalopram (LEXAPRO) 20 MG tablet TAKE 1 TABLET(20 MG) BY MOUTH DAILY 90 tablet 1   flavoxATE (URISPAS) 100 MG tablet Take 1 tablet (100 mg total) by mouth 3 (three) times daily as needed for bladder spasms. 30 tablet 0   ibuprofen (ADVIL) 200 MG tablet Take 200 mg by mouth every 6 (six) hours as needed. Took 4 of  200 mg tabs     LORazepam (ATIVAN) 1 MG tablet Take 1  tablet (1 mg total) by mouth 2 (two) times daily as needed for anxiety. 60 tablet 0   meclizine (ANTIVERT) 25 MG tablet Take 1 tablet (25 mg total) by mouth 3 (three) times daily as needed for dizziness. 30 tablet 0   meloxicam (MOBIC) 7.5 MG tablet TAKE 1 TABLET(7.5 MG) BY MOUTH DAILY 90 tablet 0   ondansetron (ZOFRAN) 4 MG tablet Take 1 tablet (4 mg total) by mouth every 8 (eight) hours as needed for nausea or vomiting. 20 tablet 0   phenazopyridine (PYRIDIUM) 200 MG tablet Take 1 tablet (200 mg total) by mouth 3 (three) times daily as needed for pain. 10 tablet 0   promethazine (PHENERGAN) 25 MG tablet Take 1 tablet (25 mg total) by mouth every 8 (eight) hours as needed for nausea or vomiting. 20 tablet 0   promethazine (PHENERGAN) 25 MG tablet Take 1 tablet (25 mg total) by mouth every 8 (eight) hours as needed for nausea or vomiting. 20 tablet 0   No  facility-administered medications prior to visit.    Allergies  Allergen Reactions   Amoxicillin     Severe itching    Review of Systems See pertinent positives and negatives per HPI.     Objective:    Physical Exam Vitals reviewed.  Abdominal:     General: Bowel sounds are normal.     Palpations: Abdomen is soft.     Tenderness: There is no right CVA tenderness or left CVA tenderness.  Skin:    General: Skin is warm and dry.  Neurological:     Mental Status: She is alert.   BP 110/70   Pulse 85   Temp (!) 97.1 F (36.2 C)   Ht 5\' 8"  (1.727 m)   Wt 180 lb (81.6 kg)   SpO2 99%   BMI 27.37 kg/m   Health Maintenance Due  Topic Date Due   HIV Screening  Never done   Hepatitis C Screening  Never done   TETANUS/TDAP  Never done   PAP SMEAR-Modifier  Never done   COVID-19 Vaccine (3 - Pfizer series) 02/01/2020   COLONOSCOPY (Pts 45-63yrs Insurance coverage will need to be confirmed)  Never done       Lab Results  Component Value Date   TSH 2.750 08/24/2019   Lab Results  Component Value Date   WBC 10.2 08/24/2019   HGB 13.2 08/24/2019   HCT 38.6 08/24/2019   MCV 85 08/24/2019   PLT 254 08/24/2019   Lab Results  Component Value Date   NA 141 08/24/2019   K 4.0 08/24/2019   CO2 20 08/24/2019   GLUCOSE 97 08/24/2019   BUN 13 08/24/2019   CREATININE 0.80 08/24/2019   BILITOT <0.2 08/24/2019   ALKPHOS 88 08/24/2019   AST 15 08/24/2019   ALT 9 08/24/2019   PROT 6.7 08/24/2019   ALBUMIN 4.1 08/24/2019   CALCIUM 9.5 08/24/2019   ANIONGAP 6 07/13/2014   Lab Results  Component Value Date   CHOL 200 (H) 08/24/2019   Lab Results  Component Value Date   HDL 35 (L) 08/24/2019   Lab Results  Component Value Date   LDLCALC 143 (H) 08/24/2019   Lab Results  Component Value Date   TRIG 122 08/24/2019   Lab Results  Component Value Date   CHOLHDL 5.7 (H) 08/24/2019        Assessment & Plan:     1. Gross hematuria - POCT URINALYSIS DIP  (CLINITEK) - Urine Culture -  Comprehensive metabolic panel - CBC with Differential/Platelet - Ambulatory referral to Urology - nitrofurantoin, macrocrystal-monohydrate, (MACROBID) 100 MG capsule; Take 1 capsule (100 mg total) by mouth 2 (two) times daily.  Dispense: 14 capsule; Refill: 0  2. History of kidney stones - Ambulatory referral to Urology  3. Family history of bladder cancer - Ambulatory referral to Urology  4. Cigarette smoker    Orders Placed This Encounter  Procedures   Urine Culture   POCT URINALYSIS DIP (CLINITEK)    Push fluids, especially water We will call you with labs and urology referral Take Macrobid twice daily for 7 days Seek emergency medical care for any severe or concerning symptoms Follow-up as needed  Follow-up: PRN  An After Visit Summary was printed and given to the patient.  I, Janie Morning, NP, have reviewed all documentation for this visit. The documentation on 09/02/21 for the exam, diagnosis, procedures, and orders are all accurate and complete.   Signed, Janie Morning, NP [facility] [phone]

## 2021-08-31 LAB — COMPREHENSIVE METABOLIC PANEL
ALT: 10 IU/L (ref 0–32)
AST: 13 IU/L (ref 0–40)
Albumin/Globulin Ratio: 1.6 (ref 1.2–2.2)
Albumin: 4 g/dL (ref 3.8–4.8)
Alkaline Phosphatase: 87 IU/L (ref 44–121)
BUN/Creatinine Ratio: 17 (ref 9–23)
BUN: 15 mg/dL (ref 6–24)
Bilirubin Total: <0.2 mg/dL (ref 0.0–1.2)
CO2: 25 mmol/L (ref 20–29)
Calcium: 8.4 mg/dL — ABNORMAL LOW (ref 8.7–10.2)
Chloride: 105 mmol/L (ref 96–106)
Creatinine, Ser: 0.87 mg/dL (ref 0.57–1.00)
Globulin, Total: 2.5 g/dL (ref 1.5–4.5)
Glucose: 80 mg/dL (ref 70–99)
Potassium: 4.2 mmol/L (ref 3.5–5.2)
Sodium: 142 mmol/L (ref 134–144)
Total Protein: 6.5 g/dL (ref 6.0–8.5)
eGFR: 84 mL/min/{1.73_m2} (ref >59)

## 2021-08-31 LAB — CBC WITH DIFFERENTIAL/PLATELET
Basophils Absolute: 0.1 10*3/uL (ref 0.0–0.2)
Basos: 1 %
EOS (ABSOLUTE): 0.4 10*3/uL (ref 0.0–0.4)
Eos: 4 %
Hematocrit: 38.4 % (ref 34.0–46.6)
Hemoglobin: 12.9 g/dL (ref 11.1–15.9)
Immature Grans (Abs): 0 10*3/uL (ref 0.0–0.1)
Immature Granulocytes: 0 %
Lymphocytes Absolute: 2.5 10*3/uL (ref 0.7–3.1)
Lymphs: 24 %
MCH: 30.1 pg (ref 26.6–33.0)
MCHC: 33.6 g/dL (ref 31.5–35.7)
MCV: 90 fL (ref 79–97)
Monocytes Absolute: 0.6 10*3/uL (ref 0.1–0.9)
Monocytes: 6 %
Neutrophils Absolute: 6.7 10*3/uL (ref 1.4–7.0)
Neutrophils: 65 %
Platelets: 276 10*3/uL (ref 150–450)
RBC: 4.29 x10E6/uL (ref 3.77–5.28)
RDW: 13.4 % (ref 11.7–15.4)
WBC: 10.2 10*3/uL (ref 3.4–10.8)

## 2021-09-02 LAB — URINE CULTURE

## 2022-01-15 ENCOUNTER — Other Ambulatory Visit: Payer: Self-pay | Admitting: Family Medicine

## 2022-01-15 DIAGNOSIS — Z1231 Encounter for screening mammogram for malignant neoplasm of breast: Secondary | ICD-10-CM

## 2022-01-30 ENCOUNTER — Inpatient Hospital Stay: Admission: RE | Admit: 2022-01-30 | Payer: Managed Care, Other (non HMO) | Source: Ambulatory Visit

## 2022-01-30 ENCOUNTER — Telehealth: Payer: Self-pay | Admitting: Family Medicine

## 2022-01-30 NOTE — Telephone Encounter (Signed)
LEFT VOICEMAIL TO HAVE PT CALL TO GET THEIR MAMMO APPT RESCHEDULE.

## 2022-02-22 IMAGING — MG MM DIGITAL SCREENING BILAT W/ TOMO AND CAD
8 series · 8 of 24 positions shown · non-contrast
Comparison: None.

CLINICAL DATA: Screening.

EXAM:
DIGITAL SCREENING BILATERAL MAMMOGRAM WITH TOMOSYNTHESIS AND CAD
TECHNIQUE: Bilateral screening digital craniocaudal and mediolateral oblique
mammograms were obtained. Bilateral screening digital breast
tomosynthesis was performed. The images were evaluated with
computer-aided detection.

[R MLO synth-2D]
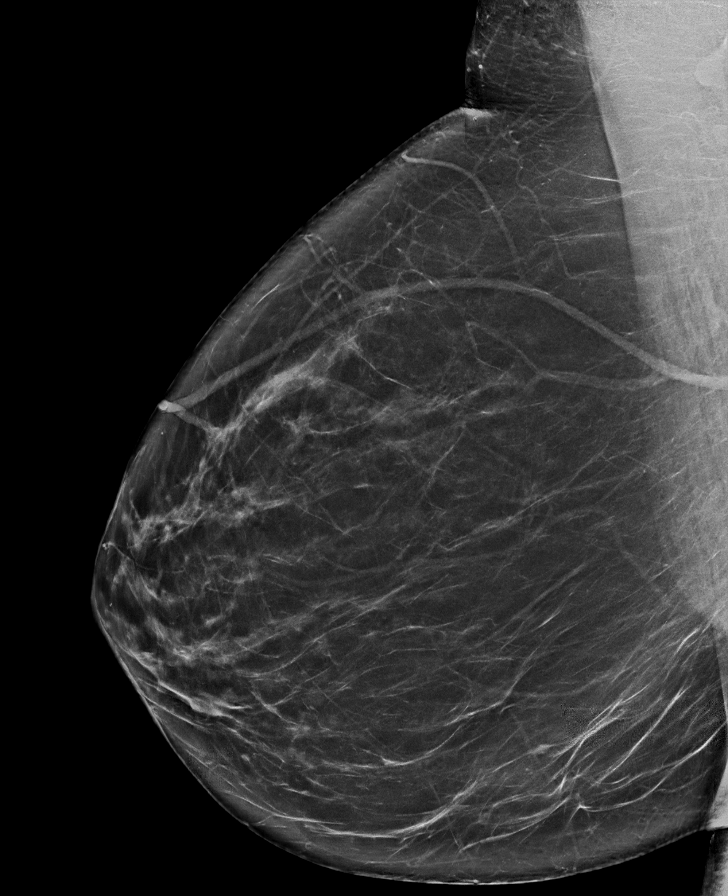

[R CC synth-2D]
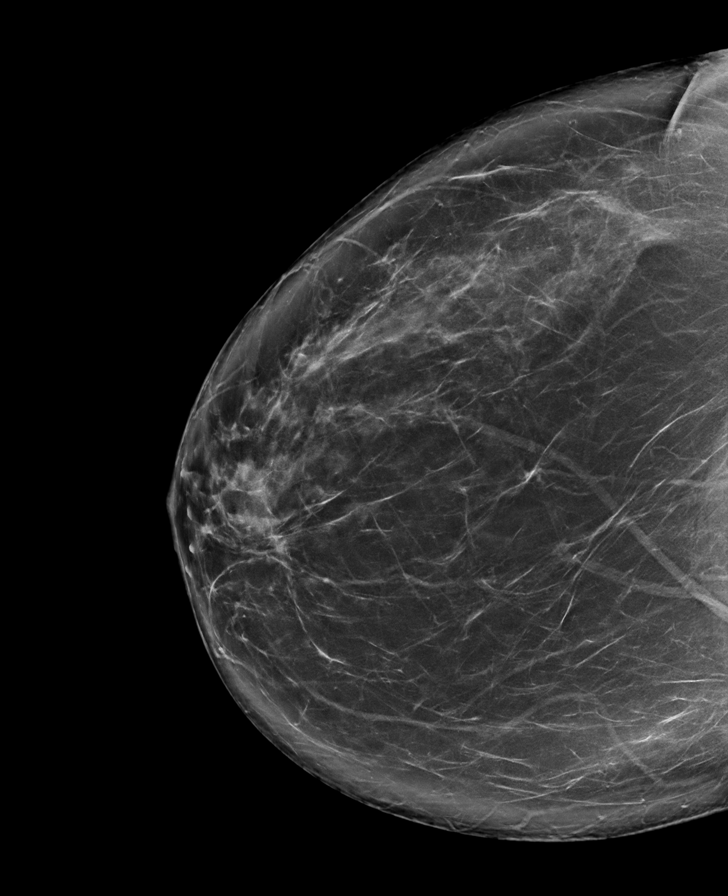

[L MLO synth-2D]
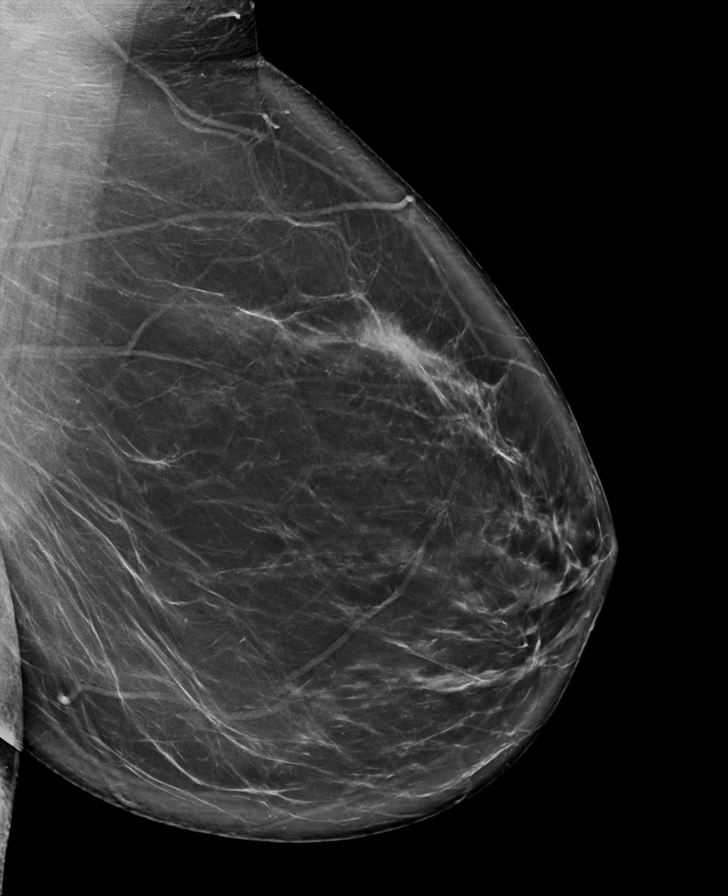

[L CC synth-2D]
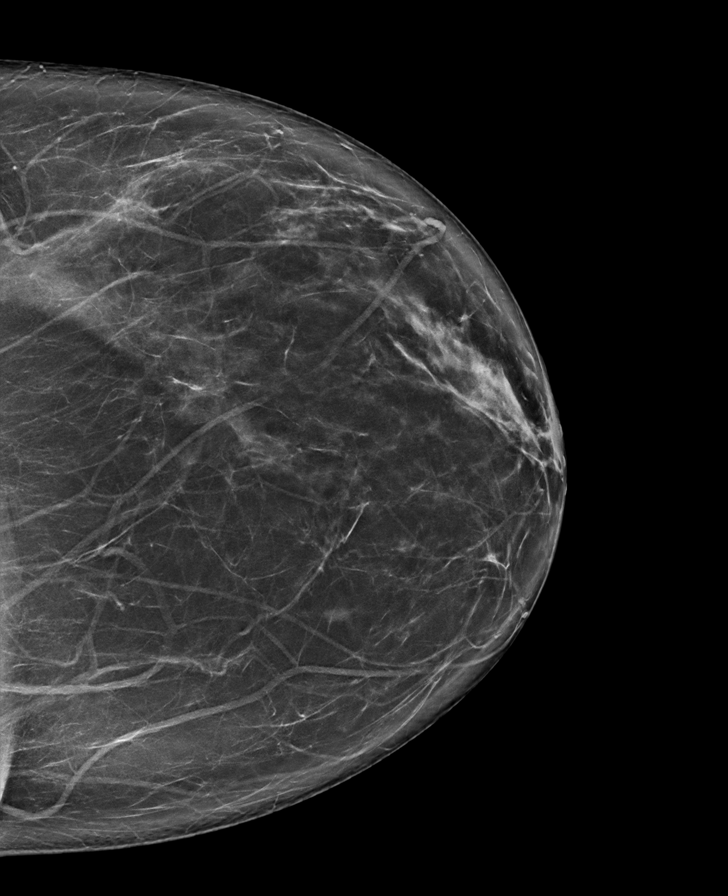

[L MLO tomo · tomo slice 51/100.0]
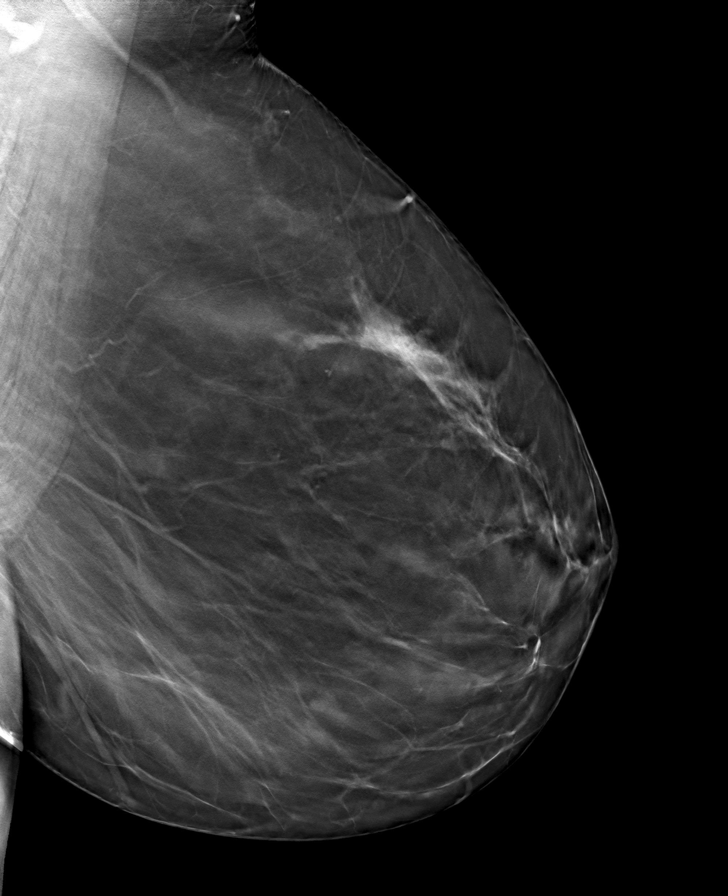

[R MLO tomo · tomo slice 47/94.0]
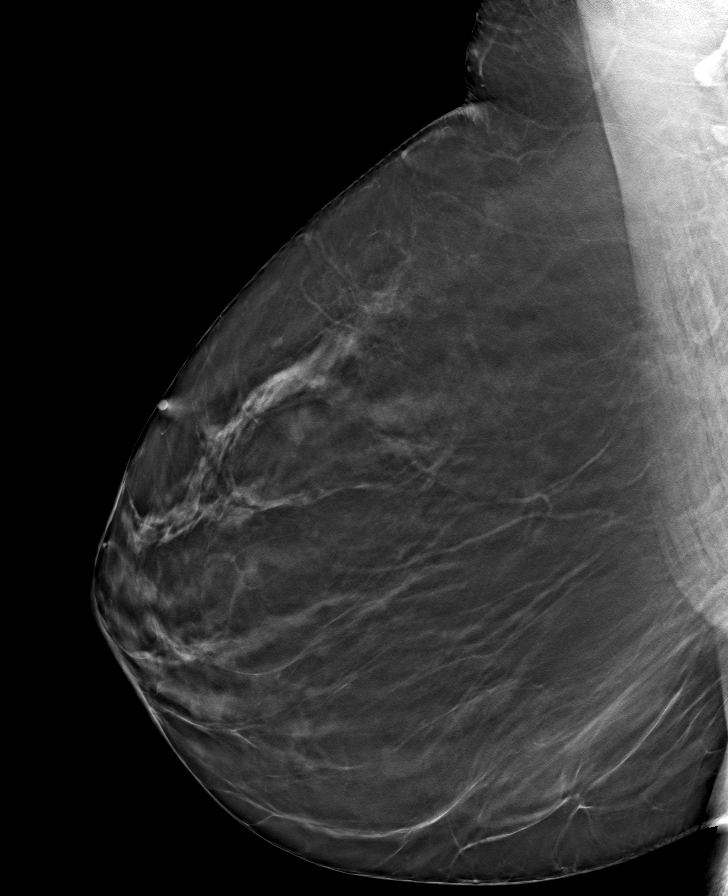

[R CC tomo · tomo slice 47/94.0]
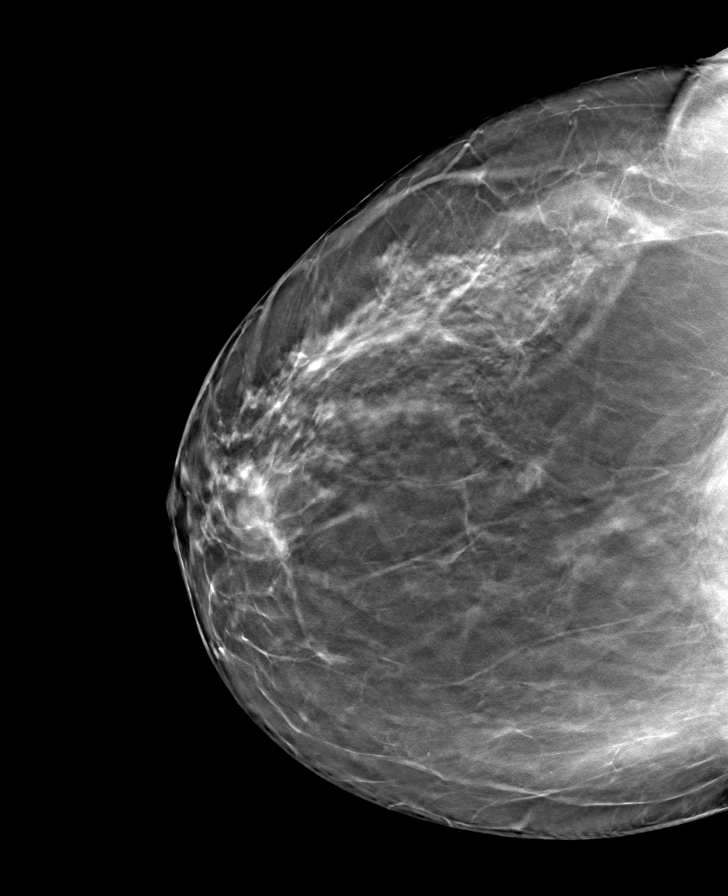

[L CC tomo · tomo slice 45/89.0]
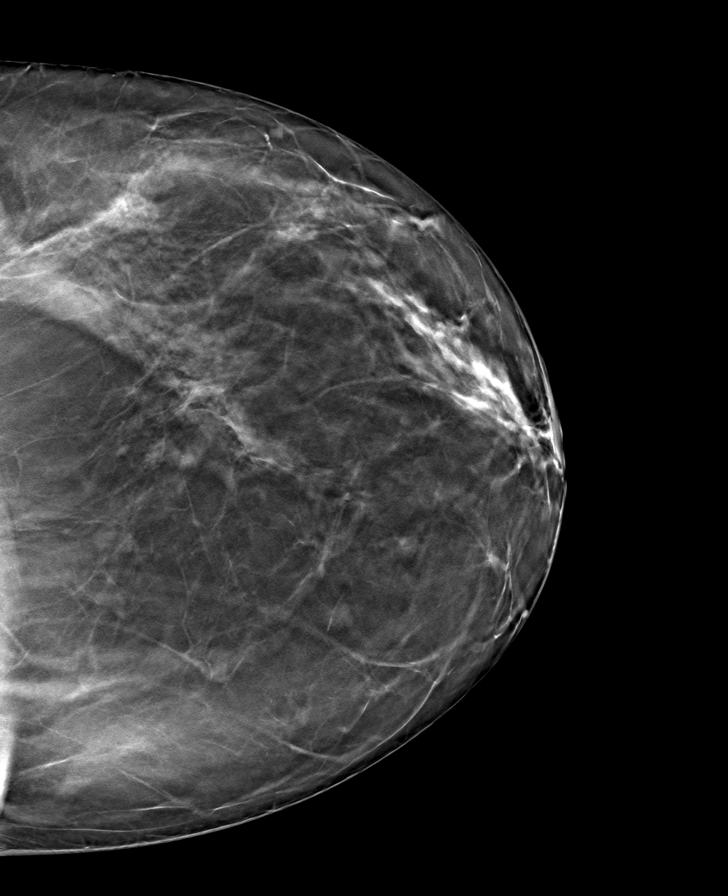

[8 of 24 positions shown; findings below may reference images not displayed]

ACR Breast Density Category b: There are scattered areas of
fibroglandular density.
FINDINGS: There are no findings suspicious for malignancy.
IMPRESSION: No mammographic evidence of malignancy. A result letter of this
screening mammogram will be mailed directly to the patient.

RECOMMENDATION:
Screening mammogram in one year. (Code:XG-X-X7B)

BI-RADS CATEGORY  1: Negative.

## 2022-03-13 ENCOUNTER — Inpatient Hospital Stay: Admission: RE | Admit: 2022-03-13 | Payer: Medicaid Other | Source: Ambulatory Visit

## 2022-04-10 ENCOUNTER — Ambulatory Visit
Admission: RE | Admit: 2022-04-10 | Discharge: 2022-04-10 | Disposition: A | Discharge status: Home / Self Care | Payer: Medicaid Other | Source: Ambulatory Visit | Attending: Family Medicine | Admitting: Family Medicine

## 2022-04-10 DIAGNOSIS — Z1231 Encounter for screening mammogram for malignant neoplasm of breast: Secondary | ICD-10-CM

## 2023-08-24 HISTORY — PX: FOOT SURGERY: SHX648

## 2023-09-09 ENCOUNTER — Ambulatory Visit: Admitting: Family Medicine

## 2023-12-03 ENCOUNTER — Ambulatory Visit (INDEPENDENT_AMBULATORY_CARE_PROVIDER_SITE_OTHER): Admitting: Family Medicine

## 2023-12-03 ENCOUNTER — Encounter: Payer: Self-pay | Admitting: Family Medicine

## 2023-12-03 VITALS — BP 134/76 | HR 92 | Temp 98.3°F | Ht 68.0 in | Wt 169.0 lb

## 2023-12-03 DIAGNOSIS — F411 Generalized anxiety disorder: Secondary | ICD-10-CM | POA: Diagnosis not present

## 2023-12-03 DIAGNOSIS — E782 Mixed hyperlipidemia: Secondary | ICD-10-CM | POA: Diagnosis not present

## 2023-12-03 DIAGNOSIS — R6882 Decreased libido: Secondary | ICD-10-CM | POA: Insufficient documentation

## 2023-12-03 DIAGNOSIS — F321 Major depressive disorder, single episode, moderate: Secondary | ICD-10-CM | POA: Insufficient documentation

## 2023-12-03 DIAGNOSIS — S92902A Unspecified fracture of left foot, initial encounter for closed fracture: Secondary | ICD-10-CM | POA: Insufficient documentation

## 2023-12-03 DIAGNOSIS — R202 Paresthesia of skin: Secondary | ICD-10-CM | POA: Insufficient documentation

## 2023-12-03 DIAGNOSIS — E559 Vitamin D deficiency, unspecified: Secondary | ICD-10-CM | POA: Insufficient documentation

## 2023-12-03 DIAGNOSIS — R232 Flushing: Secondary | ICD-10-CM | POA: Insufficient documentation

## 2023-12-03 DIAGNOSIS — Z23 Encounter for immunization: Secondary | ICD-10-CM | POA: Diagnosis not present

## 2023-12-03 MED ORDER — ESCITALOPRAM OXALATE 10 MG PO TABS: 10.0000 mg | ORAL_TABLET | Freq: Every day | ORAL | 0 refills | Status: DC

## 2023-12-03 NOTE — Progress Notes (Signed)
 Subjective:  Patient ID: Monica Banks, female    DOB: Sep 10, 1975  Age: 48 y.o. MRN: [id number]  Chief Complaint  Patient presents with   Depression    HPI: Discussed the use of AI scribe software for clinical note transcription with the patient, who gave verbal consent to proceed.  History of Present Illness Monica Banks is a 48 year old female who presents with hot flashes, night sweats, and mood changes.  Vasomotor and neurocognitive symptoms - Hot flashes and night sweats present - Frequent episodes of feeling 'a little foggy' - Menstrual cycles remain regular, with occasional episodes of two periods in a month (last occurrence six months ago)  Mood disturbance and anhedonia - Depressed and sad mood, particularly around the 2 year anniversary of her stepson's suicide - No suicidal ideation - Loss of pleasure or interest in activities - Reduced libido - Feeling down or depressed less than four days per week - Marked fatigue and low energy despite sleeping six to seven hours nightly - Unintentional weight loss of approximately 15 pounds since June (from 180 to 169 pounds) - No significant changes in appetite or eating habits - She and her husband separated one month ago.  Anxiety symptoms - Episodes of anxiety with sweaty hands - Cognitive fogginess during anxiety episodes - No chest pain, shortness of breath, bowel or bladder dysfunction, or peripheral edema (other than in the affected foot)  Post-traumatic foot pain and sensory changes - Sustained a Lisfranc fracture in June after stepping off back steps - Underwent surgical repair on July 8th with placement of three plates and three screws with Dr. Burt - Persistent aching pain in the affected foot, described as 'toothache'-like - Sharp, tingly sensations in the affected foot - Current pain management includes hydrocodone 10 mg/acetaminophen 325 mg and meloxicam  daily   Hyperlipidemia: Lipids up in 2023.        12/03/2023    4:15 PM 01/04/2020    3:51 PM  Depression screen PHQ 2/9  Decreased Interest 1 0  Down, Depressed, Hopeless 1 0  PHQ - 2 Score 2 0  Altered sleeping 0 0  Tired, decreased energy 2 0  Change in appetite 0 0  Feeling bad or failure about yourself  0 0  Trouble concentrating 1 0  Moving slowly or fidgety/restless 0 0  Suicidal thoughts 0 0  PHQ-9 Score 5 0  Difficult doing work/chores Somewhat difficult Not difficult at all        01/04/2020    3:50 PM  Fall Risk   Falls in the past year? 0  Number falls in past yr: 0  Injury with Fall? 0    Patient Care Team: Sherre Clapper, MD as PCP - General (Family Medicine)   Review of Systems  Constitutional:  Positive for fatigue.  HENT:  Negative for congestion, ear pain and sore throat.   Respiratory:  Negative for shortness of breath.   Cardiovascular:  Negative for chest pain.  Musculoskeletal:  Positive for arthralgias.  Psychiatric/Behavioral:  Positive for dysphoric mood. The patient is nervous/anxious.     No current outpatient medications on file prior to visit.   No current facility-administered medications on file prior to visit.   Past Medical History:  Diagnosis Date   Depression    History of gestational diabetes    History of kidney stones    Other specified anxiety disorders    Periodontal disease    on flagyl and clindamycin   Past  Surgical History:  Procedure Laterality Date   DILATION AND EVACUATION N/A 04/15/2019   Procedure: DILATATION AND EVACUATION;  Surgeon: Marne Kelly Nest, MD;  Location: [facility];  Service: Gynecology;  Laterality: N/A;   FOOT SURGERY Left 08/24/2023   Dr. Burt   LITHOTRIPSY     x 1   VOCAL CORD LATERALIZATION, ENDOSCOPIC APPROACH W/ MLB     vocal cord CYST removal    Family History  Problem Relation Age of Onset   Hyperlipidemia Mother    Diabetes Mother    Diabetes Brother    Breast cancer Neg Hx    Social History   Socioeconomic  History   Marital status: Divorced    Spouse name: Not on file   Number of children: Not on file   Years of education: Not on file   Highest education level: Not on file  Occupational History   Not on file  Tobacco Use   Smoking status: Every Day    Current packs/day: 1.00    Average packs/day: 1 pack/day for 29.0 years (29.0 ttl pk-yrs)    Types: Cigarettes   Smokeless tobacco: Never  Vaping Use   Vaping status: Never Used  Substance and Sexual Activity   Alcohol use: Never   Drug use: Yes    Types: Marijuana    Comment: marijuana last used 04-12-2019 for nerves   Sexual activity: Not on file  Other Topics Concern   Not on file  Social History Narrative   Not on file   Social Drivers of Health   Financial Resource Strain: Low Risk  (12/03/2023)   Overall Financial Resource Strain (CARDIA)    Difficulty of Paying Living Expenses: Not hard at all  Food Insecurity: No Food Insecurity (12/03/2023)   Hunger Vital Sign    Worried About Running Out of Food in the Last Year: Never true    Ran Out of Food in the Last Year: Never true  Transportation Needs: No Transportation Needs (12/03/2023)   PRAPARE - Administrator, Civil Service (Medical): No    Lack of Transportation (Non-Medical): No  Physical Activity: Inactive (12/03/2023)   Exercise Vital Sign    Days of Exercise per Week: 0 days    Minutes of Exercise per Session: 0 min  Stress: No Stress Concern Present (12/03/2023)   [employer] of Occupational Health - Occupational Stress Questionnaire    Feeling of Stress: Not at all  Social Connections: Socially Isolated (12/03/2023)   Social Connection and Isolation Panel    Frequency of Communication with Friends and Family: Twice a week    Frequency of Social Gatherings with Friends and Family: Twice a week    Attends Religious Services: Never    Database administrator or Organizations: No    Attends Engineer, structural: Never    Marital Status:  Divorced    Objective:  BP 134/76   Pulse 92   Temp 98.3 F (36.8 C)   Ht 5' 8 (1.727 m)   Wt 169 lb (76.7 kg)   SpO2 98%   BMI 25.70 kg/m      12/03/2023    3:50 PM 08/30/2021    2:30 PM 04/10/2021    8:57 AM  BP/Weight  Systolic BP 134 110 138  Diastolic BP 76 70 82  Wt. (Lbs) 169 180 200  BMI 25.7 kg/m2 27.37 kg/m2 30.41 kg/m2    Physical Exam Vitals reviewed.  Constitutional:  Appearance: Normal appearance. She is normal weight.  Cardiovascular:     Rate and Rhythm: Normal rate and regular rhythm.     Heart sounds: Normal heart sounds.  Pulmonary:     Effort: Pulmonary effort is normal. No respiratory distress.     Breath sounds: Normal breath sounds.  Abdominal:     General: Abdomen is flat. Bowel sounds are normal.     Palpations: Abdomen is soft.     Tenderness: There is no abdominal tenderness.  Musculoskeletal:     Comments: Left foot in surgical shoe.   Neurological:     Mental Status: She is alert and oriented to person, place, and time.  Psychiatric:        Behavior: Behavior normal.     Comments: tearful         Lab Results  Component Value Date   WBC 10.2 08/30/2021   HGB 12.9 08/30/2021   HCT 38.4 08/30/2021   PLT 276 08/30/2021   GLUCOSE 80 08/30/2021   CHOL 200 (H) 08/24/2019   TRIG 122 08/24/2019   HDL 35 (L) 08/24/2019   LDLCALC 143 (H) 08/24/2019   ALT 10 08/30/2021   AST 13 08/30/2021   NA 142 08/30/2021   K 4.2 08/30/2021   CL 105 08/30/2021   CREATININE 0.87 08/30/2021   BUN 15 08/30/2021   CO2 25 08/30/2021   TSH 2.750 08/24/2019    Results for orders placed or performed in visit on 08/30/21  POCT URINALYSIS DIP (CLINITEK)   Collection Time: 08/30/21 11:04 AM  Result Value Ref Range   Color, UA other (A) yellow   Clarity, UA     Glucose, UA negative negative mg/dL   Bilirubin, UA negative negative   Ketones, POC UA negative negative mg/dL   Spec Grav, UA >=8.969 (A) 1.010 - 1.025   Blood, UA large (A)  negative   pH, UA 6.0 5.0 - 8.0   POC PROTEIN,UA =100 (A) negative, trace   Urobilinogen, UA 1.0 0.2 or 1.0 E.U./dL   Nitrite, UA Negative Negative   Leukocytes, UA Small (1+) (A) Negative  Urine Culture   Collection Time: 08/30/21 11:53 AM   Specimen: Urine   UR  Result Value Ref Range   Urine Culture, Routine Final report (A)    Organism ID, Bacteria Comment (A)   Comprehensive metabolic panel   Collection Time: 08/30/21  3:01 PM  Result Value Ref Range   Glucose 80 70 - 99 mg/dL   BUN 15 6 - 24 mg/dL   Creatinine, Ser 9.12 0.57 - 1.00 mg/dL   eGFR 84 >40 fO/fpw/8.26   BUN/Creatinine Ratio 17 9 - 23   Sodium 142 134 - 144 mmol/L   Potassium 4.2 3.5 - 5.2 mmol/L   Chloride 105 96 - 106 mmol/L   CO2 25 20 - 29 mmol/L   Calcium 8.4 (L) 8.7 - 10.2 mg/dL   Total Protein 6.5 6.0 - 8.5 g/dL   Albumin 4.0 3.8 - 4.8 g/dL   Globulin, Total 2.5 1.5 - 4.5 g/dL   Albumin/Globulin Ratio 1.6 1.2 - 2.2   Bilirubin Total <0.2 0.0 - 1.2 mg/dL   Alkaline Phosphatase 87 44 - 121 IU/L   AST 13 0 - 40 IU/L   ALT 10 0 - 32 IU/L  CBC with Differential/Platelet   Collection Time: 08/30/21  3:01 PM  Result Value Ref Range   WBC 10.2 3.4 - 10.8 x10E3/uL   RBC 4.29 3.77 - 5.28 x10E6/uL  Hemoglobin 12.9 11.1 - 15.9 g/dL   Hematocrit 61.5 65.9 - 46.6 %   MCV 90 79 - 97 fL   MCH 30.1 26.6 - 33.0 pg   MCHC 33.6 31.5 - 35.7 g/dL   RDW 86.5 88.2 - 84.5 %   Platelets 276 150 - 450 x10E3/uL   Neutrophils 65 Not Estab. %   Lymphs 24 Not Estab. %   Monocytes 6 Not Estab. %   Eos 4 Not Estab. %   Basos 1 Not Estab. %   Neutrophils Absolute 6.7 1.4 - 7.0 x10E3/uL   Lymphocytes Absolute 2.5 0.7 - 3.1 x10E3/uL   Monocytes Absolute 0.6 0.1 - 0.9 x10E3/uL   EOS (ABSOLUTE) 0.4 0.0 - 0.4 x10E3/uL   Basophils Absolute 0.1 0.0 - 0.2 x10E3/uL   Immature Granulocytes 0 Not Estab. %   Immature Grans (Abs) 0.0 0.0 - 0.1 x10E3/uL  .  Assessment & Plan:   Assessment & Plan Mixed hyperlipidemia Check  lipids.  Orders:   CBC with Differential   Comprehensive metabolic panel with GFR   Lipid Panel  Hot flashes Check labs Orders:   FSH/LH  GAD (generalized anxiety disorder) Reports feeling down, anxious, and experiencing loss of interest and energy, exacerbated by grief. Previously benefited from Lexapro . No suicidal ideation. - Restart Lexapro  at 10 mg daily, with the possibility to increase as needed.    Moderate major depression (HCC) Reports feeling down, anxious, and experiencing loss of interest and energy, exacerbated by grief. Previously benefited from Lexapro . No suicidal ideation. - Restart Lexapro  at 10 mg daily, with the possibility to increase as needed.    Decreased libido Check labs Orders:   Testosterone , Free and Total  Vitamin D insufficiency Check labs    Paresthesia Check labs  Orders:   TSH   B12 and Folate Panel   Methylmalonic acid, serum  Encounter for immunization  Orders:   Flu vaccine HIGH DOSE PF(Fluzone Trivalent)  Closed fracture of left foot, initial encounter Lisfranc fracture of right foot, post-surgical, with ongoing pain Post-surgical Lisfranc fracture with ongoing pain managed by hydrocodone-acetaminophen and meloxicam . Pain persists but is managed with current medication regimen. - Continue hydrocodone-acetaminophen for pain management. - Continue meloxicam  as prescribed by the orthopedic surgeon.    Body mass index is 25.7 kg/m.     Meds ordered this encounter  Medications   escitalopram  (LEXAPRO ) 10 MG tablet    Sig: Take 1 tablet (10 mg total) by mouth daily.    Dispense:  90 tablet    Refill:  0    Orders Placed This Encounter  Procedures   Flu vaccine HIGH DOSE PF(Fluzone Trivalent)   CBC with Differential   Comprehensive metabolic panel with GFR   TSH   Lipid Panel   FSH/LH   B12 and Folate Panel   Methylmalonic acid, serum   Testosterone , Free and Total       Follow-up: Return in about 4 weeks  (around 12/31/2023).  An After Visit Summary was printed and given to the patient.  Abigail Free, MD [facility] [phone]

## 2023-12-03 NOTE — Assessment & Plan Note (Addendum)
 Reports feeling down, anxious, and experiencing loss of interest and energy, exacerbated by grief. Previously benefited from Lexapro . No suicidal ideation. - Restart Lexapro  at 10 mg daily, with the possibility to increase as needed.

## 2023-12-03 NOTE — Assessment & Plan Note (Signed)
 Lisfranc fracture of right foot, post-surgical, with ongoing pain Post-surgical Lisfranc fracture with ongoing pain managed by hydrocodone-acetaminophen and meloxicam . Pain persists but is managed with current medication regimen. - Continue hydrocodone-acetaminophen for pain management. - Continue meloxicam  as prescribed by the orthopedic surgeon.

## 2023-12-03 NOTE — Assessment & Plan Note (Addendum)
 Check labs Orders:   Testosterone , Free and Total

## 2023-12-03 NOTE — Assessment & Plan Note (Addendum)
 Check lipids.  Orders:   CBC with Differential   Comprehensive metabolic panel with GFR   Lipid Panel

## 2023-12-03 NOTE — Assessment & Plan Note (Addendum)
 Check labs  Orders:   TSH   B12 and Folate Panel   Methylmalonic acid, serum

## 2023-12-03 NOTE — Assessment & Plan Note (Addendum)
 Check labs

## 2023-12-03 NOTE — Assessment & Plan Note (Addendum)
 Check labs Orders:   FSH/LH

## 2023-12-04 ENCOUNTER — Other Ambulatory Visit: Payer: Self-pay | Admitting: Family Medicine

## 2023-12-09 LAB — CBC WITH DIFFERENTIAL/PLATELET
Absolute Lymphocytes: 1557 {cells}/uL (ref 850–3900)
Absolute Monocytes: 374 {cells}/uL (ref 200–950)
Basophils Absolute: 48 {cells}/uL (ref 0–200)
Basophils Relative: 0.7 %
Eosinophils Absolute: 61 {cells}/uL (ref 15–500)
Eosinophils Relative: 0.9 %
HCT: 36.8 % (ref 35.0–45.0)
Hemoglobin: 11.8 g/dL (ref 11.7–15.5)
MCH: 27.5 pg (ref 27.0–33.0)
MCHC: 32.1 g/dL (ref 32.0–36.0)
MCV: 85.8 fL (ref 80.0–100.0)
MPV: 10.5 fL (ref 7.5–12.5)
Monocytes Relative: 5.5 %
Neutro Abs: 4760 {cells}/uL (ref 1500–7800)
Neutrophils Relative %: 70 %
Platelets: 298 Thousand/uL (ref 140–400)
RBC: 4.29 Million/uL (ref 3.80–5.10)
RDW: 13.7 % (ref 11.0–15.0)
Total Lymphocyte: 22.9 %
WBC: 6.8 Thousand/uL (ref 3.8–10.8)

## 2023-12-09 LAB — LIPID PANEL
Cholesterol: 193 mg/dL (ref <200)
HDL: 36 mg/dL — ABNORMAL LOW (ref >=50)
LDL Cholesterol (Calc): 133 mg/dL — ABNORMAL HIGH
Non-HDL Cholesterol (Calc): 157 mg/dL — ABNORMAL HIGH (ref <130)
Total CHOL/HDL Ratio: 5.4 (calc) — ABNORMAL HIGH (ref <5.0)
Triglycerides: 128 mg/dL (ref <150)

## 2023-12-09 LAB — COMPREHENSIVE METABOLIC PANEL WITH GFR
AG Ratio: 1.6 (calc) (ref 1.0–2.5)
ALT: 6 U/L (ref 6–29)
AST: 10 U/L (ref 10–35)
Albumin: 4.2 g/dL (ref 3.6–5.1)
Alkaline phosphatase (APISO): 89 U/L (ref 31–125)
BUN: 9 mg/dL (ref 7–25)
CO2: 23 mmol/L (ref 20–32)
Calcium: 9.9 mg/dL (ref 8.6–10.2)
Chloride: 105 mmol/L (ref 98–110)
Creat: 0.64 mg/dL (ref 0.50–0.99)
Globulin: 2.6 g/dL (ref 1.9–3.7)
Glucose, Bld: 95 mg/dL (ref 65–99)
Potassium: 3.8 mmol/L (ref 3.5–5.3)
Sodium: 137 mmol/L (ref 135–146)
Total Bilirubin: 0.3 mg/dL (ref 0.2–1.2)
Total Protein: 6.8 g/dL (ref 6.1–8.1)
eGFR: 110 mL/min/1.73m2 (ref >=60)

## 2023-12-09 LAB — TSH: TSH: 1.57 m[IU]/L

## 2023-12-09 LAB — B12 AND FOLATE PANEL
Folate: 6.3 ng/mL
Vitamin B-12: 239 pg/mL (ref 200–1100)

## 2023-12-09 LAB — TESTOSTERONE, TOTAL, LC/MS/MS: Testosterone, Total, LC-MS-MS: 21 ng/dL (ref 2–45)

## 2023-12-09 LAB — FSH/LH
FSH: 8.2 m[IU]/mL
LH: 9.1 m[IU]/mL

## 2023-12-09 LAB — METHYLMALONIC ACID, SERUM: Methylmalonic Acid, Quant: 236 nmol/L (ref 55–335)

## 2023-12-11 ENCOUNTER — Ambulatory Visit: Payer: Self-pay | Admitting: Family Medicine

## 2023-12-30 NOTE — Progress Notes (Signed)
 "  Subjective:  Patient ID: Monica Banks, female    DOB: 1975/08/06  Age: 48 y.o. MRN: [id number]  Chief Complaint  Patient presents with   Depression    HPI: Discussed the use of AI scribe software for clinical note transcription with the patient, who gave verbal consent to proceed.  History of Present Illness Monica Banks is a 48 year old female who presents for follow-up on Lexapro  and other health concerns.  Mood symptoms and antidepressant therapy - Mood symptoms improved on Lexapro  10 mg daily - No desire to increase Lexapro  dose at this time  Vasomotor symptoms - Hot flashes present but less severe than previously  Musculoskeletal injury and rehabilitation - Persistent limp due to incompletely healed foot injury - Currently undergoing physical therapy  Hyperlipidemia and tobacco use - Recent laboratory evaluation revealed mildly elevated cholesterol levels - Continues to smoke, but has reduced quantity and switched to ultralight cigarettes  Preventive health and cancer screening - No Pap smear in over five years - No colon cancer screening to date - No family history of colon cancer  Immunization status - Previously received influenza vaccine - No recent pneumococcal or tetanus vaccination - Uncertain hepatitis B vaccination status  Constitutional and infectious symptoms - No fevers, chills, sweats, or sore throat       12/31/2023    3:50 PM 12/03/2023    4:15 PM 01/04/2020    3:51 PM  Depression screen PHQ 2/9  Decreased Interest 0 1 0  Down, Depressed, Hopeless 0 1 0  PHQ - 2 Score 0 2 0  Altered sleeping 0 0 0  Tired, decreased energy 0 2 0  Change in appetite 0 0 0  Feeling bad or failure about yourself  0 0 0  Trouble concentrating 0 1 0  Moving slowly or fidgety/restless 0 0 0  Suicidal thoughts 0 0 0  PHQ-9 Score 0 5 0  Difficult doing work/chores Not difficult at all Somewhat difficult Not difficult at all        01/04/2020    3:50 PM   Fall Risk   Falls in the past year? 0  Number falls in past yr: 0  Injury with Fall? 0    Patient Care Team: Sherre Clapper, MD as PCP - General (Family Medicine)   Review of Systems  Constitutional:  Negative for chills, fatigue and fever.  HENT:  Negative for congestion, ear pain and sore throat.   Respiratory:  Negative for cough and shortness of breath.   Cardiovascular:  Negative for chest pain.  Gastrointestinal:  Negative for abdominal pain, constipation, diarrhea, nausea and vomiting.  Genitourinary:  Negative for dysuria and urgency.  Musculoskeletal:  Negative for arthralgias and myalgias.  Skin:  Negative for rash.  Neurological:  Negative for dizziness and headaches.  Psychiatric/Behavioral:  Negative for dysphoric mood. The patient is not nervous/anxious.     No current outpatient medications on file prior to visit.   No current facility-administered medications on file prior to visit.   Past Medical History:  Diagnosis Date   Depression    History of gestational diabetes    History of kidney stones    Other specified anxiety disorders    Periodontal disease    on flagyl and clindamycin   Past Surgical History:  Procedure Laterality Date   DILATION AND EVACUATION N/A 04/15/2019   Procedure: DILATATION AND EVACUATION;  Surgeon: Marne Kelly Nest, MD;  Location: [facility];  Service: Gynecology;  Laterality: N/A;   FOOT SURGERY Left 08/24/2023   Dr. Burt   LITHOTRIPSY     x 1   VOCAL CORD LATERALIZATION, ENDOSCOPIC APPROACH W/ MLB     vocal cord CYST removal    Family History  Problem Relation Age of Onset   Hyperlipidemia Mother    Diabetes Mother    Diabetes Brother    Breast cancer Neg Hx    Social History   Socioeconomic History   Marital status: Divorced    Spouse name: Not on file   Number of children: Not on file   Years of education: Not on file   Highest education level: Not on file  Occupational History   Not on  file  Tobacco Use   Smoking status: Every Day    Current packs/day: 1.00    Average packs/day: 1 pack/day for 29.0 years (29.0 ttl pk-yrs)    Types: Cigarettes   Smokeless tobacco: Never  Vaping Use   Vaping status: Never Used  Substance and Sexual Activity   Alcohol use: Never   Drug use: Yes    Types: Marijuana    Comment: marijuana last used 04-12-2019 for nerves   Sexual activity: Not on file  Other Topics Concern   Not on file  Social History Narrative   Not on file   Social Drivers of Health   Financial Resource Strain: Low Risk  (12/03/2023)   Overall Financial Resource Strain (CARDIA)    Difficulty of Paying Living Expenses: Not hard at all  Food Insecurity: No Food Insecurity (12/03/2023)   Hunger Vital Sign    Worried About Running Out of Food in the Last Year: Never true    Ran Out of Food in the Last Year: Never true  Transportation Needs: No Transportation Needs (12/03/2023)   PRAPARE - Administrator, Civil Service (Medical): No    Lack of Transportation (Non-Medical): No  Physical Activity: Inactive (12/03/2023)   Exercise Vital Sign    Days of Exercise per Week: 0 days    Minutes of Exercise per Session: 0 min  Stress: No Stress Concern Present (12/03/2023)   [employer] of Occupational Health - Occupational Stress Questionnaire    Feeling of Stress: Not at all  Social Connections: Socially Isolated (12/03/2023)   Social Connection and Isolation Panel    Frequency of Communication with Friends and Family: Twice a week    Frequency of Social Gatherings with Friends and Family: Twice a week    Attends Religious Services: Never    Diplomatic Services Operational Officer: No    Attends Engineer, Structural: Never    Marital Status: Divorced    Objective:  BP 120/80   Pulse 80   Temp 98.1 F (36.7 C)   Ht 5' 8 (1.727 m)   Wt 161 lb (73 kg)   LMP 12/16/2023   SpO2 98%   BMI 24.48 kg/m      12/31/2023    3:48 PM 12/03/2023     3:50 PM 08/30/2021    2:30 PM  BP/Weight  Systolic BP 120 134 110  Diastolic BP 80 76 70  Wt. (Lbs) 161 169 180  BMI 24.48 kg/m2 25.7 kg/m2 27.37 kg/m2    Physical Exam Vitals reviewed.  Constitutional:      Appearance: Normal appearance. She is normal weight.  Cardiovascular:     Rate and Rhythm: Normal rate and regular rhythm.     Heart sounds: Normal heart sounds.  Pulmonary:     Effort: Pulmonary effort is normal. No respiratory distress.     Breath sounds: Normal breath sounds.  Neurological:     Mental Status: She is alert and oriented to person, place, and time.  Psychiatric:        Mood and Affect: Mood normal.        Behavior: Behavior normal.         Lab Results  Component Value Date   WBC 6.8 12/04/2023   HGB 11.8 12/04/2023   HCT 36.8 12/04/2023   PLT 298 12/04/2023   GLUCOSE 95 12/04/2023   CHOL 193 12/04/2023   TRIG 128 12/04/2023   HDL 36 (L) 12/04/2023   LDLCALC 133 (H) 12/04/2023   ALT 6 12/04/2023   AST 10 12/04/2023   NA 137 12/04/2023   K 3.8 12/04/2023   CL 105 12/04/2023   CREATININE 0.64 12/04/2023   BUN 9 12/04/2023   CO2 23 12/04/2023   TSH 1.57 12/04/2023    Results for orders placed or performed in visit on 12/04/23  Lipid panel   Collection Time: 12/04/23 10:40 AM  Result Value Ref Range   Cholesterol 193 <200 mg/dL   HDL 36 (L) > OR = 50 mg/dL   Triglycerides 871 <849 mg/dL   LDL Cholesterol (Calc) 133 (H) mg/dL (calc)   Total CHOL/HDL Ratio 5.4 (H) <5.0 (calc)   Non-HDL Cholesterol (Calc) 157 (H) <130 mg/dL (calc)  Comprehensive metabolic panel with GFR   Collection Time: 12/04/23 10:40 AM  Result Value Ref Range   Glucose, Bld 95 65 - 99 mg/dL   BUN 9 7 - 25 mg/dL   Creat 9.35 9.49 - 9.00 mg/dL   eGFR 889 > OR = 60 fO/fpw/8.26f7   BUN/Creatinine Ratio SEE NOTE: 6 - 22 (calc)   Sodium 137 135 - 146 mmol/L   Potassium 3.8 3.5 - 5.3 mmol/L   Chloride 105 98 - 110 mmol/L   CO2 23 20 - 32 mmol/L   Calcium 9.9 8.6 -  10.2 mg/dL   Total Protein 6.8 6.1 - 8.1 g/dL   Albumin 4.2 3.6 - 5.1 g/dL   Globulin 2.6 1.9 - 3.7 g/dL (calc)   AG Ratio 1.6 1.0 - 2.5 (calc)   Total Bilirubin 0.3 0.2 - 1.2 mg/dL   Alkaline phosphatase (APISO) 89 31 - 125 U/L   AST 10 10 - 35 U/L   ALT 6 6 - 29 U/L  TSH   Collection Time: 12/04/23 10:40 AM  Result Value Ref Range   TSH 1.57 mIU/L  Methylmalonic acid, serum   Collection Time: 12/04/23 10:40 AM  Result Value Ref Range   Methylmalonic Acid, Quant 236 55 - 335 nmol/L  CBC with Differential/Platelet   Collection Time: 12/04/23 10:40 AM  Result Value Ref Range   WBC 6.8 3.8 - 10.8 Thousand/uL   RBC 4.29 3.80 - 5.10 Million/uL   Hemoglobin 11.8 11.7 - 15.5 g/dL   HCT 63.1 64.9 - 54.9 %   MCV 85.8 80.0 - 100.0 fL   MCH 27.5 27.0 - 33.0 pg   MCHC 32.1 32.0 - 36.0 g/dL   RDW 86.2 88.9 - 84.9 %   Platelets 298 140 - 400 Thousand/uL   MPV 10.5 7.5 - 12.5 fL   Neutro Abs 4,760 1,500 - 7,800 cells/uL   Absolute Lymphocytes 1,557 850 - 3,900 cells/uL   Absolute Monocytes 374 200 - 950 cells/uL   Eosinophils Absolute 61 15 - 500 cells/uL  Basophils Absolute 48 0 - 200 cells/uL   Neutrophils Relative % 70 %   Total Lymphocyte 22.9 %   Monocytes Relative 5.5 %   Eosinophils Relative 0.9 %   Basophils Relative 0.7 %  B12 and Folate Panel   Collection Time: 12/04/23 10:40 AM  Result Value Ref Range   Vitamin B-12 239 200 - 1,100 pg/mL   Folate 6.3 ng/mL  FSH/LH   Collection Time: 12/04/23 10:40 AM  Result Value Ref Range   FSH 8.2 mIU/mL   LH 9.1 mIU/mL  Testosterone , Total, LC/MS/MS   Collection Time: 12/04/23 10:40 AM  Result Value Ref Range   Testosterone , Total, LC-MS-MS 21 2 - 45 ng/dL  .  Assessment & Plan:   Assessment & Plan Mixed hyperlipidemia Cholesterol levels slightly elevated. - Recommend a low-fat diet and regular exercise.    Moderate major depression (HCC) Depression well-managed with Lexapro  10 mg, significant symptom improvement. -  Continue Lexapro  10 mg daily. - Provide a 90-day prescription for Lexapro .    Hot flashes Menopausal symptoms, particularly hot flashes, are less severe. Lexapro  may aid improvement.    Right foot pain Chronic right foot pain persists with some improvement, undergoing physical therapy.    Tobacco dependence Resumed smoking with reduced quantity, switched to ultralight cigarettes. Smoking cessation crucial for health.     Body mass index is 24.48 kg/m.   Meds ordered this encounter  Medications   escitalopram  (LEXAPRO ) 10 MG tablet    Sig: Take 1 tablet (10 mg total) by mouth daily.    Dispense:  90 tablet    Refill:  3    No orders of the defined types were placed in this encounter.  I,Marla I Leal-Borjas,acting as a scribe for Abigail Free, MD.,have documented all relevant documentation on the behalf of Abigail Free, MD,as directed by  Abigail Free, MD while in the presence of Abigail Free, MD.    Follow-up: Return for cpe.  An After Visit Summary was printed and given to the patient.  I attest that I have reviewed this visit and agree with the plan scribed by my staff.   Abigail Free, MD [facility] [phone]    "

## 2023-12-31 ENCOUNTER — Ambulatory Visit (INDEPENDENT_AMBULATORY_CARE_PROVIDER_SITE_OTHER): Admitting: Family Medicine

## 2023-12-31 ENCOUNTER — Encounter: Payer: Self-pay | Admitting: Family Medicine

## 2023-12-31 VITALS — BP 120/80 | HR 80 | Temp 98.1°F | Ht 68.0 in | Wt 161.0 lb

## 2023-12-31 DIAGNOSIS — M79671 Pain in right foot: Secondary | ICD-10-CM | POA: Diagnosis not present

## 2023-12-31 DIAGNOSIS — F321 Major depressive disorder, single episode, moderate: Secondary | ICD-10-CM | POA: Diagnosis not present

## 2023-12-31 DIAGNOSIS — R232 Flushing: Secondary | ICD-10-CM | POA: Diagnosis not present

## 2023-12-31 DIAGNOSIS — E782 Mixed hyperlipidemia: Secondary | ICD-10-CM

## 2023-12-31 DIAGNOSIS — F17200 Nicotine dependence, unspecified, uncomplicated: Secondary | ICD-10-CM

## 2023-12-31 MED ORDER — ESCITALOPRAM OXALATE 10 MG PO TABS: 10.0000 mg | ORAL_TABLET | Freq: Every day | ORAL | 3 refills | Status: AC

## 2024-01-01 DIAGNOSIS — M79671 Pain in right foot: Secondary | ICD-10-CM | POA: Insufficient documentation

## 2024-01-01 NOTE — Assessment & Plan Note (Signed)
 Resumed smoking with reduced quantity, switched to ultralight cigarettes. Smoking cessation crucial for health.

## 2024-01-01 NOTE — Assessment & Plan Note (Signed)
 Menopausal symptoms, particularly hot flashes, are less severe. Lexapro  may aid improvement.

## 2024-01-01 NOTE — Assessment & Plan Note (Signed)
 Chronic right foot pain persists with some improvement, undergoing physical therapy.

## 2024-01-01 NOTE — Assessment & Plan Note (Signed)
 Cholesterol levels slightly elevated. - Recommend a low-fat diet and regular exercise.

## 2024-01-01 NOTE — Assessment & Plan Note (Signed)
 Depression well-managed with Lexapro  10 mg, significant symptom improvement. - Continue Lexapro  10 mg daily. - Provide a 90-day prescription for Lexapro .
